# Patient Record
Sex: Female | Born: 1960
Health system: Southern US, Community
[De-identification: ages and names within clinical notes are randomized; demographics above are authoritative.]

## PROBLEM LIST (undated history)

## (undated) ENCOUNTER — Emergency Department (HOSPITAL_BASED_OUTPATIENT_CLINIC_OR_DEPARTMENT_OTHER): Admission: RE | Payer: BLUE CROSS/BLUE SHIELD | Source: Ambulatory Visit

## (undated) DIAGNOSIS — K5790 Diverticulosis of intestine, part unspecified, without perforation or abscess without bleeding: Secondary | ICD-10-CM

## (undated) DIAGNOSIS — N200 Calculus of kidney: Secondary | ICD-10-CM

## (undated) DIAGNOSIS — K219 Gastro-esophageal reflux disease without esophagitis: Secondary | ICD-10-CM

## (undated) DIAGNOSIS — K2210 Ulcer of esophagus without bleeding: Secondary | ICD-10-CM

## (undated) DIAGNOSIS — I10 Essential (primary) hypertension: Secondary | ICD-10-CM

## (undated) DIAGNOSIS — M722 Plantar fascial fibromatosis: Secondary | ICD-10-CM

## (undated) DIAGNOSIS — K449 Diaphragmatic hernia without obstruction or gangrene: Secondary | ICD-10-CM

## (undated) HISTORY — DX: Plantar fascial fibromatosis: M72.2

## (undated) HISTORY — DX: Diaphragmatic hernia without obstruction or gangrene: K44.9

## (undated) HISTORY — PX: HERNIA REPAIR: SHX51

## (undated) HISTORY — DX: Essential (primary) hypertension: I10

## (undated) HISTORY — DX: Gastro-esophageal reflux disease without esophagitis: K21.9

## (undated) HISTORY — PX: DILATION AND CURETTAGE OF UTERUS: SHX78

## (undated) HISTORY — DX: Ulcer of esophagus without bleeding: K22.10

## (undated) HISTORY — DX: Diverticulosis of intestine, part unspecified, without perforation or abscess without bleeding: K57.90

---

## 1998-02-21 ENCOUNTER — Inpatient Hospital Stay (HOSPITAL_COMMUNITY): Admission: AD | Admit: 1998-02-21 | Discharge: 1998-02-24 | Payer: Self-pay | Admitting: *Deleted

## 1998-04-05 ENCOUNTER — Ambulatory Visit (HOSPITAL_COMMUNITY): Admission: RE | Admit: 1998-04-05 | Discharge: 1998-04-05 | Payer: Self-pay | Admitting: Obstetrics and Gynecology

## 1999-04-09 ENCOUNTER — Other Ambulatory Visit: Admission: RE | Admit: 1999-04-09 | Discharge: 1999-04-09 | Payer: Self-pay | Admitting: Obstetrics and Gynecology

## 2000-03-11 ENCOUNTER — Ambulatory Visit (HOSPITAL_BASED_OUTPATIENT_CLINIC_OR_DEPARTMENT_OTHER): Admission: RE | Admit: 2000-03-11 | Discharge: 2000-03-11 | Payer: Self-pay | Admitting: *Deleted

## 2000-05-05 ENCOUNTER — Other Ambulatory Visit: Admission: RE | Admit: 2000-05-05 | Discharge: 2000-05-05 | Payer: Self-pay | Admitting: Obstetrics and Gynecology

## 2000-12-13 HISTORY — PX: INGUINAL HERNIA REPAIR: SHX194

## 2001-05-02 ENCOUNTER — Other Ambulatory Visit: Admission: RE | Admit: 2001-05-02 | Discharge: 2001-05-02 | Payer: Self-pay | Admitting: Obstetrics and Gynecology

## 2002-04-13 ENCOUNTER — Encounter: Admission: RE | Admit: 2002-04-13 | Discharge: 2002-04-13 | Payer: Self-pay | Admitting: Obstetrics and Gynecology

## 2002-04-13 ENCOUNTER — Encounter: Payer: Self-pay | Admitting: Obstetrics and Gynecology

## 2002-04-19 ENCOUNTER — Other Ambulatory Visit: Admission: RE | Admit: 2002-04-19 | Discharge: 2002-04-19 | Payer: Self-pay | Admitting: Obstetrics and Gynecology

## 2003-04-23 ENCOUNTER — Other Ambulatory Visit: Admission: RE | Admit: 2003-04-23 | Discharge: 2003-04-23 | Payer: Self-pay | Admitting: Obstetrics and Gynecology

## 2003-06-27 ENCOUNTER — Encounter: Payer: Self-pay | Admitting: Obstetrics and Gynecology

## 2003-06-27 ENCOUNTER — Encounter: Admission: RE | Admit: 2003-06-27 | Discharge: 2003-06-27 | Payer: Self-pay | Admitting: Obstetrics and Gynecology

## 2004-02-13 ENCOUNTER — Encounter: Admission: RE | Admit: 2004-02-13 | Discharge: 2004-02-13 | Payer: Self-pay | Admitting: Internal Medicine

## 2004-07-09 ENCOUNTER — Encounter: Admission: RE | Admit: 2004-07-09 | Discharge: 2004-07-09 | Payer: Self-pay | Admitting: Obstetrics and Gynecology

## 2005-08-05 ENCOUNTER — Encounter: Admission: RE | Admit: 2005-08-05 | Discharge: 2005-08-05 | Payer: Self-pay | Admitting: Obstetrics and Gynecology

## 2006-01-18 ENCOUNTER — Ambulatory Visit: Payer: Self-pay | Admitting: Internal Medicine

## 2006-01-21 ENCOUNTER — Ambulatory Visit: Payer: Self-pay | Admitting: Internal Medicine

## 2006-08-18 ENCOUNTER — Encounter: Admission: RE | Admit: 2006-08-18 | Discharge: 2006-08-18 | Payer: Self-pay | Admitting: Obstetrics and Gynecology

## 2007-09-11 ENCOUNTER — Encounter: Payer: Self-pay | Admitting: Internal Medicine

## 2007-09-11 DIAGNOSIS — G5600 Carpal tunnel syndrome, unspecified upper limb: Secondary | ICD-10-CM | POA: Insufficient documentation

## 2007-09-28 ENCOUNTER — Encounter: Admission: RE | Admit: 2007-09-28 | Discharge: 2007-09-28 | Payer: Self-pay | Admitting: Obstetrics and Gynecology

## 2007-10-02 ENCOUNTER — Encounter: Payer: Self-pay | Admitting: Internal Medicine

## 2008-10-25 ENCOUNTER — Encounter: Admission: RE | Admit: 2008-10-25 | Discharge: 2008-10-25 | Payer: Self-pay | Admitting: Obstetrics and Gynecology

## 2009-03-17 ENCOUNTER — Ambulatory Visit: Payer: Self-pay | Admitting: Internal Medicine

## 2009-03-17 LAB — CONVERTED CEMR LAB
AST: 22 units/L (ref 0–37)
Albumin: 3.6 g/dL (ref 3.5–5.2)
Alkaline Phosphatase: 59 units/L (ref 39–117)
BUN: 11 mg/dL (ref 6–23)
Bilirubin, Direct: 0.1 mg/dL (ref 0.0–0.3)
Calcium: 8.8 mg/dL (ref 8.4–10.5)
Creatinine, Ser: 0.8 mg/dL (ref 0.4–1.2)
Eosinophils Absolute: 0.3 10*3/uL (ref 0.0–0.7)
Leukocytes, UA: NEGATIVE
Lymphs Abs: 2.3 10*3/uL (ref 0.7–4.0)
Neutro Abs: 2.9 10*3/uL (ref 1.4–7.7)
Neutrophils Relative %: 49.7 % (ref 43.0–77.0)
Nitrite: NEGATIVE
Potassium: 4.2 meq/L (ref 3.5–5.1)
RBC: 4.5 M/uL (ref 3.87–5.11)
RDW: 12.2 % (ref 11.5–14.6)
Sodium: 144 meq/L (ref 135–145)
Total Protein: 6.2 g/dL (ref 6.0–8.3)
Urobilinogen, UA: 0.2 (ref 0.0–1.0)
WBC: 5.9 10*3/uL (ref 4.5–10.5)

## 2009-03-21 ENCOUNTER — Ambulatory Visit: Payer: Self-pay | Admitting: Internal Medicine

## 2009-03-21 DIAGNOSIS — M722 Plantar fascial fibromatosis: Secondary | ICD-10-CM

## 2009-03-24 LAB — CONVERTED CEMR LAB
Direct LDL: 182.6 mg/dL
Total CHOL/HDL Ratio: 5
Triglycerides: 158 mg/dL — ABNORMAL HIGH (ref 0.0–149.0)

## 2009-10-30 ENCOUNTER — Encounter: Admission: RE | Admit: 2009-10-30 | Discharge: 2009-10-30 | Payer: Self-pay | Admitting: Obstetrics and Gynecology

## 2010-11-03 ENCOUNTER — Encounter: Admission: RE | Admit: 2010-11-03 | Discharge: 2010-11-03 | Payer: Self-pay | Admitting: Obstetrics and Gynecology

## 2011-02-18 ENCOUNTER — Other Ambulatory Visit: Payer: Self-pay | Admitting: Internal Medicine

## 2011-02-18 ENCOUNTER — Other Ambulatory Visit: Payer: BC Managed Care – PPO

## 2011-02-18 ENCOUNTER — Encounter (INDEPENDENT_AMBULATORY_CARE_PROVIDER_SITE_OTHER): Payer: Self-pay | Admitting: *Deleted

## 2011-02-18 DIAGNOSIS — Z0000 Encounter for general adult medical examination without abnormal findings: Secondary | ICD-10-CM

## 2011-02-18 LAB — LIPID PANEL
Cholesterol: 194 mg/dL (ref 0–200)
Total CHOL/HDL Ratio: 4
Triglycerides: 49 mg/dL (ref 0.0–149.0)
VLDL: 9.8 mg/dL (ref 0.0–40.0)

## 2011-02-18 LAB — CBC WITH DIFFERENTIAL/PLATELET
Basophils Absolute: 0 10*3/uL (ref 0.0–0.1)
Basophils Relative: 0.8 % (ref 0.0–3.0)
Eosinophils Relative: 3.5 % (ref 0.0–5.0)
HCT: 40.5 % (ref 36.0–46.0)
Lymphocytes Relative: 44.4 % (ref 12.0–46.0)
Lymphs Abs: 2.3 10*3/uL (ref 0.7–4.0)
MCHC: 34.7 g/dL (ref 30.0–36.0)
MCV: 94.4 fl (ref 78.0–100.0)
Monocytes Relative: 4.5 % (ref 3.0–12.0)
Neutro Abs: 2.4 10*3/uL (ref 1.4–7.7)
RBC: 4.3 Mil/uL (ref 3.87–5.11)
RDW: 12.2 % (ref 11.5–14.6)

## 2011-02-18 LAB — HEPATIC FUNCTION PANEL
Albumin: 3.7 g/dL (ref 3.5–5.2)
Total Bilirubin: 0.6 mg/dL (ref 0.3–1.2)
Total Protein: 5.9 g/dL — ABNORMAL LOW (ref 6.0–8.3)

## 2011-02-18 LAB — BASIC METABOLIC PANEL
Calcium: 8.6 mg/dL (ref 8.4–10.5)
GFR: 101.17 mL/min (ref >60.00)
Sodium: 140 mEq/L (ref 135–145)

## 2011-02-18 LAB — URINALYSIS, ROUTINE W REFLEX MICROSCOPIC
Nitrite: NEGATIVE
pH: 6 (ref 5.0–8.0)

## 2011-02-23 ENCOUNTER — Encounter: Payer: Self-pay | Admitting: Internal Medicine

## 2011-02-23 ENCOUNTER — Encounter (INDEPENDENT_AMBULATORY_CARE_PROVIDER_SITE_OTHER): Payer: BC Managed Care – PPO | Admitting: Internal Medicine

## 2011-02-23 DIAGNOSIS — M2550 Pain in unspecified joint: Secondary | ICD-10-CM | POA: Insufficient documentation

## 2011-02-23 DIAGNOSIS — Z0000 Encounter for general adult medical examination without abnormal findings: Secondary | ICD-10-CM

## 2011-02-23 DIAGNOSIS — R030 Elevated blood-pressure reading, without diagnosis of hypertension: Secondary | ICD-10-CM | POA: Insufficient documentation

## 2011-02-23 DIAGNOSIS — I10 Essential (primary) hypertension: Secondary | ICD-10-CM | POA: Insufficient documentation

## 2011-03-02 NOTE — Assessment & Plan Note (Signed)
Summary: PHYSICAL / LOV 2010 /NWS  #   Vital Signs:  Patient profile:   50 year old female Height:      61 inches Weight:      140 pounds BMI:     26.55 Temp:     98.6 degrees F oral Pulse rate:   88 / minute Pulse rhythm:   regular Resp:     16 per minute BP sitting:   150 / 100  (right arm) Cuff size:   regular  Vitals Entered By: Lanier Prude, Beverly Gust) (February 23, 2011 2:29 PM) CC: CPX Is Patient Diabetic? No   CC:  CPX.  History of Present Illness: The patient presents for a preventive health examination C/o B hips hurt sometimes  Current Medications (verified): 1)  Vitamin D3 1000 Unit  Tabs (Cholecalciferol) .Marland Kitchen.. 1 By Mouth Daily 2)  Loratadine 10 Mg Tabs (Loratadine) .Marland Kitchen.. 1 By Mouth Once Daily  Allergies (verified): 1)  ! Lidocaine in D5w (Lidocaine in D5w)  Past History:  Past Medical History: Last updated: 03/21/2009 Gestational DM Plantar fasciitis GYN Dr Billy Coast  Past Surgical History: Last updated: 09/11/2007 Inguinal herniorrhaphy  Family History: Last updated: 02/23/2011 No CAD S HTN  Social History: Last updated: 02/23/2011 Occupation:housewife Married, 2 sons Never Smoked Regular exercise-yoga  Family History: No CAD S HTN  Social History: Occupation:housewife Married, 2 sons Never Smoked Regular exercise-yoga  Review of Systems  The patient denies anorexia, fever, weight loss, weight gain, vision loss, decreased hearing, hoarseness, chest pain, syncope, dyspnea on exertion, peripheral edema, prolonged cough, headaches, hemoptysis, abdominal pain, melena, hematochezia, severe indigestion/heartburn, hematuria, incontinence, genital sores, muscle weakness, suspicious skin lesions, transient blindness, difficulty walking, depression, unusual weight change, abnormal bleeding, enlarged lymph nodes, angioedema, and breast masses.    Physical Exam  General:  Well-developed,well-nourished,in no acute distress; alert,appropriate and  cooperative throughout examination Head:  Normocephalic and atraumatic without obvious abnormalities. No apparent alopecia or balding. Eyes:  No corneal or conjunctival inflammation noted. EOMI. Perrla. Ears:  External ear exam shows no significant lesions or deformities.  Otoscopic examination reveals clear canals, tympanic membranes are intact bilaterally without bulging, retraction, inflammation or discharge. Hearing is grossly normal bilaterally. Nose:  External nasal examination shows no deformity or inflammation. Nasal mucosa are pink and moist without lesions or exudates. Mouth:  Oral mucosa and oropharynx without lesions or exudates.  Teeth in good repair. Neck:  No deformities, masses, or tenderness noted. Lungs:  Normal respiratory effort, chest expands symmetrically. Lungs are clear to auscultation, no crackles or wheezes. Heart:  Normal rate and regular rhythm. S1 and S2 normal without gallop, murmur, click, rub or other extra sounds. Abdomen:  Bowel sounds positive,abdomen soft and non-tender without masses, organomegaly or hernias noted. Msk:  No deformity or scoliosis noted of thoracic or lumbar spine.  R heel is not  tender. B hips tender Pulses:  R and L carotid,radial,femoral,dorsalis pedis and posterior tibial pulses are full and equal bilaterally Extremities:  No clubbing, cyanosis, edema, or deformity noted with normal full range of motion of all joints.   Neurologic:  No cranial nerve deficits noted. Station and gait are normal. Plantar reflexes are down-going bilaterally. DTRs are symmetrical throughout. Sensory, motor and coordinative functions appear intact. Skin:  Intact without suspicious lesions or rashes Moles on face Cervical Nodes:  No lymphadenopathy noted Inguinal Nodes:  No significant adenopathy Psych:  Cognition and judgment appear intact. Alert and cooperative with normal attention span and concentration. No apparent delusions, illusions,  hallucinations   Impression & Recommendations:  Problem # 1:  WELL ADULT EXAM (ICD-V70.0) Assessment New Health and age related issues were discussed. Available screening tests and vaccinations were discussed as well. Healthy life style including good diet and exercise was discussed.  The labs were reviewed with the patient.  Orders: EKG w/ Interpretation (93000)nl  Problem # 2:  ELEVATED BLOOD PRESSURE (ICD-796.2) Assessment: New  Her updated medication list for this problem includes:    Losartan Potassium 100 Mg Tabs (Losartan potassium) .Marland Kitchen... 1 by mouth once daily for blood pressure  Problem # 3:  HIP PAIN (ICD-719.45) B troch bursitis Assessment: New Motrin 2 two times a day We can inject if needed  Complete Medication List: 1)  Vitamin D3 1000 Unit Tabs (Cholecalciferol) .Marland Kitchen.. 1 by mouth daily 2)  Loratadine 10 Mg Tabs (Loratadine) .Marland Kitchen.. 1 by mouth once daily 3)  Losartan Potassium 100 Mg Tabs (Losartan potassium) .Marland Kitchen.. 1 by mouth once daily for blood pressure 4)  Blood Pressure Monitor  .... Use once daily  Patient Instructions: 1)  Please schedule a follow-up appointment in 3 months. 2)  Normal BP <130/85 3)  Go on Youtube ([email or web address].com) and look up "piriformis stretch",  "IT band stretch" and "gluteus stretch". Learn the anatomy and learn the symptoms. Do the stretches - it may help!  Prescriptions: BLOOD PRESSURE MONITOR use once daily  #1 x 0   Entered and Authorized by:   Tresa Garter MD   Signed by:   Tresa Garter MD on 02/23/2011   Method used:   Print then Give to Patient   RxID:   [id number] ZDGLOVFI POTASSIUM 100 MG TABS (LOSARTAN POTASSIUM) 1 by mouth once daily for blood pressure  #30 x 12   Entered and Authorized by:   Tresa Garter MD   Signed by:   Tresa Garter MD on 02/23/2011   Method used:   Print then Give to Patient   RxID:   [id number]    Orders Added: 1)  EKG w/ Interpretation [93000] 2)  Est. Patient  40-64 years [01601]

## 2011-04-30 NOTE — Op Note (Signed)
[place]  Patient:    Cheryl Jones, Cheryl Jones                        MRN: [id number] Proc. Date: 03/11/00 Adm. Date:  [id number] Attending:  Stephenie Acres                           Operative Report  PREOPERATIVE DIAGNOSIS:  Left inguinal hernia.  POSTOPERATIVE DIAGNOSIS:  Left inguinal hernia.  OPERATION:  Laparoscopic left inguinal hernia repair.  SURGEON:  Catalina Lunger, M.D.  ANESTHESIA:  General  DESCRIPTION OF PROCEDURE:  The patient was taken to the operating room and placed in the supine position.  After adequate anesthesia was induced using endotracheal tube, the abdomen was prepped and draped in a normal sterile fashion. Foley catheter was placed.  A transverse infraumbilical incision was made, dissected own to the anterior left rectus sheath.  Small transverse incision was made in this  left and the left rectus muscle fibers were retracted laterally.  Blunt dissection was carried out in this preperitoneal space down to the pubic tubercle. Dissecting balloon was then placed into the identical space and the balloon manually insufflated under camera direction. Good dissection was obtained.  Operating trocar was then inserted and the operating balloon was insufflated.  Pneumoperitoneum as obtained with carbon dioxide.  Under direct visualization two 5 mm ports were placed just right of the midline in the lower abdomen.  Dissection began down at the pubic tubercle which was completely cleaned off as was the Coopers ligament. Hypogastric vessels which appeared to have partially fallen were clipped and divided as they lay right near the hernia defect.  A fairly large indirect hernia sac was then reduced and dissected free of surrounding tissues.  Dissection continued removing all areolar tissue out to the anterior superior iliac spine. A piece of 4 x 6 onlay atria mesh was then placed into the preperitoneal  space, was tacked medially just to the right of the midline to the pubic tubercle along the left Coopers ligament along the anterior abdominal wall completely covering the  direct hernia defect space, out laterally to the anterior superior iliac spine.  The indirect hernia sac was then tacked out laterally to the mesh as well. Adequate hemostasis was ensured.  The pneumoperitoneum was released.  All trocars were removed.  The anterior sheath defect was closed with a 0 Vicryl figure-of-eight suture. Skin incisions were closed with subcuticular 4-0 Monocryl. Steri-Strips and sterile dressings were applied.  All incisions were injected using Marcaine.  The patient tolerated the procedure well and went to the PACU in good condition. DD:  03/11/00 TD:  03/12/00 Job: [id number]

## 2011-06-04 ENCOUNTER — Encounter: Payer: Self-pay | Admitting: Internal Medicine

## 2011-06-07 ENCOUNTER — Encounter: Payer: Self-pay | Admitting: Internal Medicine

## 2011-06-07 ENCOUNTER — Ambulatory Visit (INDEPENDENT_AMBULATORY_CARE_PROVIDER_SITE_OTHER): Payer: BC Managed Care – PPO | Admitting: Internal Medicine

## 2011-06-07 DIAGNOSIS — I10 Essential (primary) hypertension: Secondary | ICD-10-CM

## 2011-06-07 DIAGNOSIS — M25559 Pain in unspecified hip: Secondary | ICD-10-CM

## 2011-06-07 DIAGNOSIS — R030 Elevated blood-pressure reading, without diagnosis of hypertension: Secondary | ICD-10-CM

## 2011-06-07 DIAGNOSIS — H6590 Unspecified nonsuppurative otitis media, unspecified ear: Secondary | ICD-10-CM

## 2011-06-07 MED ORDER — AMOXICILLIN 500 MG PO CAPS: 1000.0000 mg | ORAL_CAPSULE | Freq: Two times a day (BID) | ORAL | Status: AC

## 2011-06-07 MED ORDER — MELOXICAM 15 MG PO TABS: 15.0000 mg | ORAL_TABLET | Freq: Every day | ORAL | Status: DC | PRN

## 2011-06-07 MED ORDER — FLUTICASONE PROPIONATE 50 MCG/ACT NA SUSP: 1.0000 | Freq: Every day | NASAL | Status: DC

## 2011-06-07 NOTE — Assessment & Plan Note (Signed)
Cont ARB

## 2011-06-07 NOTE — Progress Notes (Signed)
  Subjective:    Patient ID: Cheryl Jones, female    DOB: 1961/09/15, 50 y.o.   MRN: [id number]  HPI F/u HTN C/o URI sx' s C/o R hip pain Scuba dove  In the keys - ears not feeling right since memorial wknd  Review of Systems  Constitutional: Negative for chills, activity change, appetite change, fatigue and unexpected weight change.  HENT: Positive for congestion. Negative for mouth sores and sinus pressure.   Eyes: Negative for visual disturbance.  Respiratory: Negative for cough and chest tightness.   Gastrointestinal: Negative for nausea and abdominal pain.  Genitourinary: Negative for frequency, difficulty urinating and vaginal pain.  Musculoskeletal: Positive for arthralgias (B hip pain). Negative for back pain and gait problem.  Skin: Negative for pallor and rash.  Neurological: Negative for dizziness, tremors, weakness, numbness and headaches.  Psychiatric/Behavioral: Negative for confusion and sleep disturbance.       Objective:   Physical Exam  Constitutional: She appears well-developed and well-nourished. No distress.  HENT:  Head: Normocephalic.  Right Ear: External ear normal.  Left Ear: External ear normal.  Nose: Nose normal.  Mouth/Throat: Oropharynx is clear and moist.       Fluid behind B TMs, no redness  Eyes: Conjunctivae are normal. Pupils are equal, round, and reactive to light. Right eye exhibits no discharge. Left eye exhibits no discharge.  Neck: Normal range of motion. Neck supple. No JVD present. No tracheal deviation present. No thyromegaly present.  Cardiovascular: Normal rate, regular rhythm and normal heart sounds.   Pulmonary/Chest: No stridor. No respiratory distress. She has no wheezes.  Abdominal: Soft. Bowel sounds are normal. She exhibits no distension and no mass. There is no tenderness. There is no rebound and no guarding.  Musculoskeletal: She exhibits tenderness (B troch maj tender). She exhibits no edema.  Lymphadenopathy:    She has  no cervical adenopathy.  Neurological: She displays normal reflexes. No cranial nerve deficit. She exhibits normal muscle tone. Coordination normal.  Skin: No rash noted. No erythema.  Psychiatric: She has a normal mood and affect. Her behavior is normal. Judgment and thought content normal.          Assessment & Plan:

## 2011-06-07 NOTE — Assessment & Plan Note (Signed)
Better at home

## 2011-06-07 NOTE — Assessment & Plan Note (Signed)
Meloxicam; stretcing Will inject if not better

## 2011-06-07 NOTE — Assessment & Plan Note (Signed)
Amox Flonase

## 2011-06-14 ENCOUNTER — Other Ambulatory Visit: Payer: Self-pay | Admitting: *Deleted

## 2011-06-14 MED ORDER — LOSARTAN POTASSIUM 100 MG PO TABS: 100.0000 mg | ORAL_TABLET | Freq: Every day | ORAL | Status: DC

## 2011-06-24 ENCOUNTER — Telehealth: Payer: Self-pay

## 2011-06-24 DIAGNOSIS — H9209 Otalgia, unspecified ear: Secondary | ICD-10-CM

## 2011-06-24 NOTE — Telephone Encounter (Signed)
Patient called lm on triage stating that "her ears are still stopped up after taking antibiotics". She would like to know if she needs to see an ENT Md. Please advise Thanks

## 2011-06-25 NOTE — Telephone Encounter (Signed)
Yes. ENT cons (done) Thx

## 2011-06-28 NOTE — Telephone Encounter (Signed)
Patient notified per MD.

## 2011-07-08 ENCOUNTER — Telehealth: Payer: Self-pay | Admitting: *Deleted

## 2011-07-08 NOTE — Telephone Encounter (Signed)
Spoke w/pt - she is worried about shingles. She was around family member recently w/shingles. She did not have direct exposure to pt's blisters. She has small sore on back of her calf and is worried. Advised her to call office tomorrow for OV for eval with symptoms change, increase etc.

## 2011-07-08 NOTE — Telephone Encounter (Signed)
rec rf req for Valtrex. Please advise

## 2011-07-09 MED ORDER — VALACYCLOVIR HCL 500 MG PO TABS: 500.0000 mg | ORAL_TABLET | Freq: Two times a day (BID) | ORAL | Status: AC

## 2011-07-09 NOTE — Telephone Encounter (Signed)
Ok done. OV if problems Thx

## 2011-07-09 NOTE — Telephone Encounter (Signed)
Pt previously informed OV is symptoms worsen or persist.

## 2011-09-09 ENCOUNTER — Ambulatory Visit: Payer: BC Managed Care – PPO | Admitting: Internal Medicine

## 2011-09-20 ENCOUNTER — Encounter: Payer: Self-pay | Admitting: Internal Medicine

## 2011-09-20 ENCOUNTER — Ambulatory Visit (INDEPENDENT_AMBULATORY_CARE_PROVIDER_SITE_OTHER): Payer: BC Managed Care – PPO | Admitting: Internal Medicine

## 2011-09-20 ENCOUNTER — Ambulatory Visit (INDEPENDENT_AMBULATORY_CARE_PROVIDER_SITE_OTHER)
Admission: RE | Admit: 2011-09-20 | Discharge: 2011-09-20 | Disposition: A | Discharge status: Home / Self Care | Payer: BC Managed Care – PPO | Source: Ambulatory Visit | Attending: Internal Medicine | Admitting: Internal Medicine

## 2011-09-20 ENCOUNTER — Other Ambulatory Visit: Payer: Self-pay

## 2011-09-20 VITALS — BP 150/108 | HR 80 | Temp 98.4°F | Resp 16 | Wt 148.0 lb

## 2011-09-20 DIAGNOSIS — M25559 Pain in unspecified hip: Secondary | ICD-10-CM

## 2011-09-20 DIAGNOSIS — M722 Plantar fascial fibromatosis: Secondary | ICD-10-CM

## 2011-09-20 DIAGNOSIS — Z23 Encounter for immunization: Secondary | ICD-10-CM

## 2011-09-20 DIAGNOSIS — I10 Essential (primary) hypertension: Secondary | ICD-10-CM

## 2011-09-20 MED ORDER — TRAMADOL HCL 50 MG PO TABS: 50.0000 mg | ORAL_TABLET | Freq: Two times a day (BID) | ORAL | Status: AC | PRN

## 2011-09-20 MED ORDER — NABUMETONE 500 MG PO TABS: 500.0000 mg | ORAL_TABLET | Freq: Two times a day (BID) | ORAL | Status: DC

## 2011-09-20 MED ORDER — VALSARTAN-HYDROCHLOROTHIAZIDE 320-25 MG PO TABS: 1.0000 | ORAL_TABLET | Freq: Every day | ORAL | Status: DC

## 2011-09-20 NOTE — Progress Notes (Signed)
  Subjective:    Patient ID: Cheryl Jones, female    DOB: May 05, 1961, 50 y.o.   MRN: [id number]  HPI  C/o elev BP and L>R foot swelling x 1 wk   Review of Systems  Constitutional: Negative for chills, activity change, appetite change, fatigue and unexpected weight change.  HENT: Negative for congestion, mouth sores and sinus pressure.   Eyes: Negative for visual disturbance.  Respiratory: Negative for cough and chest tightness.   Gastrointestinal: Negative for nausea and abdominal pain.  Genitourinary: Negative for frequency, difficulty urinating and vaginal pain.  Musculoskeletal: Negative for back pain and gait problem.       B hips are tender in troch area  Skin: Negative for pallor and rash.  Neurological: Negative for dizziness, tremors, weakness, numbness and headaches.  Psychiatric/Behavioral: Negative for confusion and sleep disturbance.   Wt Readings from Last 3 Encounters:  09/20/11 148 lb (67.132 kg)  06/07/11 142 lb (64.411 kg)  02/23/11 140 lb (63.504 kg)   BP Readings from Last 3 Encounters:  09/20/11 150/108  06/07/11 140/98  02/23/11 150/100        Objective:   Physical Exam  Constitutional: She appears well-developed and well-nourished. No distress.  HENT:  Head: Normocephalic.  Right Ear: External ear normal.  Left Ear: External ear normal.  Nose: Nose normal.  Mouth/Throat: Oropharynx is clear and moist.  Eyes: Conjunctivae are normal. Pupils are equal, round, and reactive to light. Right eye exhibits no discharge. Left eye exhibits no discharge.  Neck: Normal range of motion. Neck supple. No JVD present. No tracheal deviation present. No thyromegaly present.  Cardiovascular: Normal rate, regular rhythm and normal heart sounds.   Pulmonary/Chest: No stridor. No respiratory distress. She has no wheezes.  Abdominal: Soft. Bowel sounds are normal. She exhibits no distension and no mass. There is no tenderness. There is no rebound and no guarding.    Musculoskeletal: She exhibits tenderness (B troch major areas are tender). She exhibits no edema.  Lymphadenopathy:    She has no cervical adenopathy.  Neurological: She displays normal reflexes. No cranial nerve deficit. She exhibits normal muscle tone. Coordination normal.  Skin: No rash noted. No erythema.  Psychiatric: She has a normal mood and affect. Her behavior is normal. Judgment and thought content normal.          Assessment & Plan:

## 2011-09-20 NOTE — Assessment & Plan Note (Addendum)
X ray Ortho f/u consult See Meds Stretch

## 2011-09-21 ENCOUNTER — Telehealth: Payer: Self-pay | Admitting: *Deleted

## 2011-09-21 DIAGNOSIS — M25559 Pain in unspecified hip: Secondary | ICD-10-CM

## 2011-09-21 NOTE — Telephone Encounter (Signed)
Patient requesting results of Xray.

## 2011-09-21 NOTE — Telephone Encounter (Signed)
Left detailed VM for patient.

## 2011-09-21 NOTE — Telephone Encounter (Signed)
There is bursitis of the hip present with calcium deposits due to inflamation. Steroid injection should help. I will ref to Dr Lequita Halt Cont with current meds Thx

## 2011-09-30 NOTE — Assessment & Plan Note (Signed)
Resolved

## 2011-09-30 NOTE — Assessment & Plan Note (Signed)
See Meds 

## 2011-11-16 ENCOUNTER — Ambulatory Visit: Payer: BC Managed Care – PPO | Admitting: Internal Medicine

## 2011-12-17 ENCOUNTER — Other Ambulatory Visit: Payer: Self-pay | Admitting: Obstetrics and Gynecology

## 2011-12-17 DIAGNOSIS — Z1231 Encounter for screening mammogram for malignant neoplasm of breast: Secondary | ICD-10-CM

## 2011-12-30 ENCOUNTER — Other Ambulatory Visit: Payer: Self-pay | Admitting: *Deleted

## 2011-12-30 MED ORDER — VALSARTAN-HYDROCHLOROTHIAZIDE 320-25 MG PO TABS: 1.0000 | ORAL_TABLET | Freq: Every day | ORAL | Status: DC

## 2012-01-07 ENCOUNTER — Ambulatory Visit: Payer: BC Managed Care – PPO

## 2012-01-11 ENCOUNTER — Ambulatory Visit (INDEPENDENT_AMBULATORY_CARE_PROVIDER_SITE_OTHER): Payer: BC Managed Care – PPO | Admitting: Internal Medicine

## 2012-01-11 ENCOUNTER — Encounter: Payer: Self-pay | Admitting: Internal Medicine

## 2012-01-11 VITALS — BP 120/80 | HR 84 | Temp 97.5°F | Resp 16 | Wt 152.0 lb

## 2012-01-11 DIAGNOSIS — M25559 Pain in unspecified hip: Secondary | ICD-10-CM

## 2012-01-11 DIAGNOSIS — R7309 Other abnormal glucose: Secondary | ICD-10-CM

## 2012-01-11 DIAGNOSIS — R42 Dizziness and giddiness: Secondary | ICD-10-CM

## 2012-01-11 DIAGNOSIS — I10 Essential (primary) hypertension: Secondary | ICD-10-CM

## 2012-01-11 DIAGNOSIS — R739 Hyperglycemia, unspecified: Secondary | ICD-10-CM | POA: Insufficient documentation

## 2012-01-11 MED ORDER — NABUMETONE 500 MG PO TABS: 500.0000 mg | ORAL_TABLET | Freq: Two times a day (BID) | ORAL | Status: DC

## 2012-01-11 NOTE — Assessment & Plan Note (Signed)
Continue with current prescription therapy as reflected on the Med list.  

## 2012-01-11 NOTE — Assessment & Plan Note (Signed)
Not sure of the cause. Possible atypical migraine w/o HA Call me if sx's not resolved by tomorrow

## 2012-01-11 NOTE — Progress Notes (Signed)
  Subjective:    Patient ID: Cheryl Jones, female    DOB: 10-29-1961, 51 y.o.   MRN: [id number]  HPI  F/u R hip pain - better F/u HTN  C/o dizziness since this am  Review of Systems  Constitutional: Negative.  Negative for fever, chills, diaphoresis, activity change, appetite change, fatigue and unexpected weight change.  HENT: Negative for hearing loss, ear pain, nosebleeds, congestion, sore throat, facial swelling, rhinorrhea, sneezing, mouth sores, trouble swallowing, neck pain, neck stiffness, postnasal drip, sinus pressure and tinnitus.   Eyes: Negative for pain, discharge, redness, itching and visual disturbance.  Respiratory: Negative for cough, chest tightness, shortness of breath, wheezing and stridor.   Cardiovascular: Negative for chest pain, palpitations and leg swelling.  Gastrointestinal: Negative for nausea, diarrhea, constipation, blood in stool, abdominal distention, anal bleeding and rectal pain.  Genitourinary: Negative for dysuria, urgency, frequency, hematuria, flank pain, vaginal bleeding, vaginal discharge, difficulty urinating, genital sores and pelvic pain.  Musculoskeletal: Negative for back pain, joint swelling, arthralgias and gait problem.  Skin: Negative.  Negative for rash.  Neurological: Positive for light-headedness (since this am). Negative for dizziness, tremors, seizures, syncope, speech difficulty, weakness, numbness and headaches.  Hematological: Negative for adenopathy. Does not bruise/bleed easily.  Psychiatric/Behavioral: Negative for suicidal ideas, behavioral problems, sleep disturbance, dysphoric mood and decreased concentration. The patient is not nervous/anxious.        Objective:   Physical Exam  Constitutional: She appears well-developed. No distress.  HENT:  Head: Normocephalic.  Right Ear: External ear normal.  Left Ear: External ear normal.  Nose: Nose normal.  Mouth/Throat: Oropharynx is clear and moist.  Eyes: Conjunctivae are  normal. Pupils are equal, round, and reactive to light. Right eye exhibits no discharge. Left eye exhibits no discharge.  Neck: Normal range of motion. Neck supple. No JVD present. No tracheal deviation present. No thyromegaly present.  Cardiovascular: Normal rate, regular rhythm and normal heart sounds.   Pulmonary/Chest: No stridor. No respiratory distress. She has no wheezes.  Abdominal: Soft. Bowel sounds are normal. She exhibits no distension and no mass. There is no tenderness. There is no rebound and no guarding.  Musculoskeletal: She exhibits tenderness (B hips are tender R>>L). She exhibits no edema.  Lymphadenopathy:    She has no cervical adenopathy.  Neurological: She displays normal reflexes. No cranial nerve deficit. She exhibits normal muscle tone. Coordination normal.  Skin: No rash noted. No erythema.  Psychiatric: She has a normal mood and affect. Her behavior is normal. Judgment and thought content normal.          Assessment & Plan:

## 2012-01-11 NOTE — Assessment & Plan Note (Signed)
S/p Ortho eval and 1 inj MRI is pending She has to use Relafen daily.Marland KitchenMarland Kitchen

## 2012-01-11 NOTE — Assessment & Plan Note (Signed)
1/13 with a h/o gestational DM Glucometer provided - call me w/several measurements

## 2012-01-21 ENCOUNTER — Ambulatory Visit
Admission: RE | Admit: 2012-01-21 | Discharge: 2012-01-21 | Disposition: A | Discharge status: Home / Self Care | Payer: BC Managed Care – PPO | Source: Ambulatory Visit | Attending: Obstetrics and Gynecology | Admitting: Obstetrics and Gynecology

## 2012-01-21 DIAGNOSIS — Z1231 Encounter for screening mammogram for malignant neoplasm of breast: Secondary | ICD-10-CM

## 2012-01-26 ENCOUNTER — Other Ambulatory Visit: Payer: Self-pay | Admitting: Obstetrics and Gynecology

## 2012-01-26 DIAGNOSIS — R928 Other abnormal and inconclusive findings on diagnostic imaging of breast: Secondary | ICD-10-CM

## 2012-02-04 ENCOUNTER — Other Ambulatory Visit: Payer: BC Managed Care – PPO

## 2012-02-14 ENCOUNTER — Ambulatory Visit
Admission: RE | Admit: 2012-02-14 | Discharge: 2012-02-14 | Disposition: A | Discharge status: Home / Self Care | Payer: BC Managed Care – PPO | Source: Ambulatory Visit | Attending: Obstetrics and Gynecology | Admitting: Obstetrics and Gynecology

## 2012-02-14 DIAGNOSIS — R928 Other abnormal and inconclusive findings on diagnostic imaging of breast: Secondary | ICD-10-CM

## 2012-02-17 ENCOUNTER — Telehealth: Payer: Self-pay | Admitting: *Deleted

## 2012-02-17 NOTE — Telephone Encounter (Signed)
Left mess for patient to call back.  

## 2012-02-17 NOTE — Telephone Encounter (Signed)
Pt calling stating she has rash/itching X 6 days. She thinks it could be from Relafen. She states she has stopped all meds except her Diovan HCT. She already takes a loratadine everyday for allergies. What else can she do/take? Could this be from Relafen? Please advise.

## 2012-02-17 NOTE — Telephone Encounter (Signed)
Pt informed

## 2012-02-17 NOTE — Telephone Encounter (Signed)
Could be from relafen. For itch - increase claritin to 10 mg bid, add zantac 150 bid.               Tepid to cool bath with baking soda or Aveeno

## 2012-11-05 ENCOUNTER — Other Ambulatory Visit: Payer: Self-pay | Admitting: Internal Medicine

## 2012-12-26 ENCOUNTER — Encounter: Payer: Self-pay | Admitting: Gastroenterology

## 2013-02-06 ENCOUNTER — Encounter: Payer: BC Managed Care – PPO | Admitting: Gastroenterology

## 2013-02-15 ENCOUNTER — Encounter: Payer: Self-pay | Admitting: Gastroenterology

## 2013-03-07 ENCOUNTER — Other Ambulatory Visit: Payer: Self-pay | Admitting: Internal Medicine

## 2013-03-08 ENCOUNTER — Encounter: Payer: BC Managed Care – PPO | Admitting: Gastroenterology

## 2013-03-23 ENCOUNTER — Encounter: Payer: Self-pay | Admitting: Gastroenterology

## 2013-03-31 ENCOUNTER — Other Ambulatory Visit: Payer: Self-pay | Admitting: Internal Medicine

## 2013-04-02 ENCOUNTER — Encounter: Payer: Self-pay | Admitting: *Deleted

## 2013-04-05 ENCOUNTER — Encounter: Payer: BC Managed Care – PPO | Admitting: Gastroenterology

## 2013-04-09 ENCOUNTER — Telehealth: Payer: Self-pay | Admitting: Internal Medicine

## 2013-04-09 NOTE — Telephone Encounter (Signed)
Called pt to schedule ov with Dr. Macario Golds. Left VM multiple time to call back and schedule the appt. Waittng for pt to call back

## 2013-04-10 ENCOUNTER — Encounter: Payer: BC Managed Care – PPO | Admitting: Gastroenterology

## 2013-04-20 ENCOUNTER — Encounter: Payer: Self-pay | Admitting: Internal Medicine

## 2013-04-20 ENCOUNTER — Other Ambulatory Visit (INDEPENDENT_AMBULATORY_CARE_PROVIDER_SITE_OTHER): Payer: BC Managed Care – PPO

## 2013-04-20 ENCOUNTER — Telehealth: Payer: Self-pay | Admitting: *Deleted

## 2013-04-20 ENCOUNTER — Ambulatory Visit (INDEPENDENT_AMBULATORY_CARE_PROVIDER_SITE_OTHER): Payer: BC Managed Care – PPO | Admitting: Internal Medicine

## 2013-04-20 VITALS — BP 160/90 | HR 76 | Temp 98.6°F | Resp 16 | Ht 61.0 in | Wt 152.0 lb

## 2013-04-20 DIAGNOSIS — I10 Essential (primary) hypertension: Secondary | ICD-10-CM

## 2013-04-20 DIAGNOSIS — M25559 Pain in unspecified hip: Secondary | ICD-10-CM

## 2013-04-20 DIAGNOSIS — Z0000 Encounter for general adult medical examination without abnormal findings: Secondary | ICD-10-CM

## 2013-04-20 DIAGNOSIS — R7309 Other abnormal glucose: Secondary | ICD-10-CM

## 2013-04-20 DIAGNOSIS — R739 Hyperglycemia, unspecified: Secondary | ICD-10-CM

## 2013-04-20 LAB — CBC WITH DIFFERENTIAL/PLATELET
Basophils Relative: 0.6 % (ref 0.0–3.0)
Eosinophils Absolute: 0.2 10*3/uL (ref 0.0–0.7)
Eosinophils Relative: 2.8 % (ref 0.0–5.0)
HCT: 40.8 % (ref 36.0–46.0)
Lymphs Abs: 2.6 10*3/uL (ref 0.7–4.0)
MCHC: 35.1 g/dL (ref 30.0–36.0)
MCV: 92.1 fl (ref 78.0–100.0)
Monocytes Absolute: 0.4 10*3/uL (ref 0.1–1.0)
RBC: 4.43 Mil/uL (ref 3.87–5.11)
WBC: 6.9 10*3/uL (ref 4.5–10.5)

## 2013-04-20 LAB — LIPID PANEL
Cholesterol: 222 mg/dL — ABNORMAL HIGH (ref 0–200)
Total CHOL/HDL Ratio: 4

## 2013-04-20 LAB — BASIC METABOLIC PANEL
CO2: 25 mEq/L (ref 19–32)
Chloride: 104 mEq/L (ref 96–112)
Creatinine, Ser: 0.8 mg/dL (ref 0.4–1.2)
Glucose, Bld: 82 mg/dL (ref 70–99)
Sodium: 136 mEq/L (ref 135–145)

## 2013-04-20 LAB — URINALYSIS
Bilirubin Urine: NEGATIVE
Hgb urine dipstick: NEGATIVE
Ketones, ur: NEGATIVE
Total Protein, Urine: NEGATIVE
Urine Glucose: NEGATIVE

## 2013-04-20 LAB — HEPATIC FUNCTION PANEL
ALT: 13 U/L (ref 0–35)
AST: 18 U/L (ref 0–37)
Albumin: 3.7 g/dL (ref 3.5–5.2)
Total Protein: 6.3 g/dL (ref 6.0–8.3)

## 2013-04-20 MED ORDER — MELOXICAM 15 MG PO TABS: 15.0000 mg | ORAL_TABLET | Freq: Every day | ORAL | Status: DC | PRN

## 2013-04-20 NOTE — Telephone Encounter (Signed)
CPX labs entered into Epic for 04/2014 CPX appointment.

## 2013-04-20 NOTE — Telephone Encounter (Signed)
Message copied by Carin Primrose on Fri Apr 20, 2013  9:17 AM ------      Message from: Etheleen Sia      Created: Fri Apr 20, 2013  9:00 AM      Regarding: LAB       PHYSICAL LABS IN MAY 2015 ------

## 2013-04-20 NOTE — Assessment & Plan Note (Signed)
We discussed age appropriate health related issues, including available/recomended screening tests and vaccinations. We discussed a need for adhering to healthy diet and exercise. Labs/EKG were reviewed/ordered. All questions were answered.  Ophth exam q 1-2 years Colonoscopy is scheduled Labs

## 2013-04-20 NOTE — Patient Instructions (Addendum)
Normal BP is <130/85 

## 2013-04-20 NOTE — Assessment & Plan Note (Signed)
5/14 elev BP [facility] BP at home - email me the numbers

## 2013-04-20 NOTE — Assessment & Plan Note (Signed)
BMET 

## 2013-04-20 NOTE — Assessment & Plan Note (Signed)
B trochanteric bursitis 2012 Now it is R>>L Dr Lequita Halt Try Meloxicam prn

## 2013-04-20 NOTE — Progress Notes (Signed)
Subjective:    Patient ID: Cheryl Jones, female    DOB: September 27, 1961, 52 y.o.   MRN: [id number]  HPI  The patient is here for a wellness exam. The patient has been doing well overall without major physical or psychological issues going on lately, except for her R>L hip pain is still bothering.  Review of Systems  Constitutional: Negative.  Negative for fever, chills, diaphoresis, activity change, appetite change, fatigue and unexpected weight change.  HENT: Negative for hearing loss, ear pain, nosebleeds, congestion, sore throat, facial swelling, rhinorrhea, sneezing, mouth sores, trouble swallowing, neck pain, neck stiffness, postnasal drip, sinus pressure and tinnitus.   Eyes: Negative for pain, discharge, redness, itching and visual disturbance.  Respiratory: Negative for cough, chest tightness, shortness of breath, wheezing and stridor.   Cardiovascular: Negative for chest pain, palpitations and leg swelling.  Gastrointestinal: Negative for nausea, diarrhea, constipation, blood in stool, abdominal distention, anal bleeding and rectal pain.  Genitourinary: Negative for dysuria, urgency, frequency, hematuria, flank pain, vaginal bleeding, vaginal discharge, difficulty urinating, genital sores and pelvic pain.  Musculoskeletal: Positive for arthralgias (R hip). Negative for back pain, joint swelling and gait problem.  Skin: Negative.  Negative for rash.  Neurological: Negative for dizziness, tremors, seizures, syncope, speech difficulty, weakness, numbness and headaches.  Hematological: Negative for adenopathy. Does not bruise/bleed easily.  Psychiatric/Behavioral: Negative for suicidal ideas, behavioral problems, sleep disturbance, dysphoric mood and decreased concentration. The patient is not nervous/anxious.     Wt Readings from Last 3 Encounters:  04/20/13 152 lb (68.947 kg)  01/11/12 152 lb (68.947 kg)  09/20/11 148 lb (67.132 kg)   BP Readings from Last 3 Encounters:  04/20/13  160/90  01/11/12 120/80  09/20/11 150/108        Objective:   Physical Exam  Constitutional: She appears well-developed. No distress.  HENT:  Head: Normocephalic.  Right Ear: External ear normal.  Left Ear: External ear normal.  Nose: Nose normal.  Mouth/Throat: Oropharynx is clear and moist.  Eyes: Conjunctivae are normal. Pupils are equal, round, and reactive to light. Right eye exhibits no discharge. Left eye exhibits no discharge.  Neck: Normal range of motion. Neck supple. No JVD present. No tracheal deviation present. No thyromegaly present.  Cardiovascular: Normal rate, regular rhythm and normal heart sounds.   Pulmonary/Chest: No stridor. No respiratory distress. She has no wheezes.  Abdominal: Soft. Bowel sounds are normal. She exhibits no distension and no mass. There is no tenderness. There is no rebound and no guarding.  Musculoskeletal: She exhibits no edema and no tenderness.  Lymphadenopathy:    She has no cervical adenopathy.  Neurological: She displays normal reflexes. No cranial nerve deficit. She exhibits normal muscle tone. Coordination normal.  Skin: No rash noted. No erythema.  Psychiatric: She has a normal mood and affect. Her behavior is normal. Judgment and thought content normal.  B troch major sites are tender to palpation Str leg elev is neg B  Lab Results  Component Value Date   WBC 5.2 02/18/2011   HGB 14.1 02/18/2011   HCT 40.5 02/18/2011   PLT 222.0 02/18/2011   GLUCOSE 71 02/18/2011   CHOL 194 02/18/2011   TRIG 49.0 02/18/2011   HDL 43.90 02/18/2011   LDLDIRECT 182.6 03/17/2009   LDLCALC 140* 02/18/2011   ALT 12 02/18/2011   AST 15 02/18/2011   NA 140 02/18/2011   K 4.8 02/18/2011   CL 107 02/18/2011   CREATININE 0.7 02/18/2011   BUN 11 02/18/2011   CO2  28 02/18/2011   TSH 0.56 02/18/2011          Assessment & Plan:

## 2013-05-22 ENCOUNTER — Other Ambulatory Visit: Payer: Self-pay | Admitting: Internal Medicine

## 2013-06-11 ENCOUNTER — Ambulatory Visit (AMBULATORY_SURGERY_CENTER): Payer: BC Managed Care – PPO | Admitting: *Deleted

## 2013-06-11 ENCOUNTER — Encounter: Payer: Self-pay | Admitting: Gastroenterology

## 2013-06-11 VITALS — Ht 61.0 in | Wt 157.0 lb

## 2013-06-11 DIAGNOSIS — Z1211 Encounter for screening for malignant neoplasm of colon: Secondary | ICD-10-CM

## 2013-06-11 MED ORDER — NA SULFATE-K SULFATE-MG SULF 17.5-3.13-1.6 GM/177ML PO SOLN: ORAL | Status: DC

## 2013-06-22 ENCOUNTER — Encounter: Payer: BC Managed Care – PPO | Admitting: Gastroenterology

## 2013-07-09 ENCOUNTER — Telehealth: Payer: Self-pay | Admitting: Gastroenterology

## 2013-07-09 ENCOUNTER — Encounter: Payer: BC Managed Care – PPO | Admitting: Gastroenterology

## 2013-07-09 NOTE — Telephone Encounter (Signed)
ok 

## 2013-07-09 NOTE — Telephone Encounter (Signed)
Spoke with patient. Notified her that new prep instructions will be mailed to her today. She understands. Encouraged patient to read over them and call us back with any questions. She understands. Colon procedure on 08-15-13.

## 2013-08-01 ENCOUNTER — Other Ambulatory Visit: Payer: Self-pay

## 2013-08-01 DIAGNOSIS — Z1231 Encounter for screening mammogram for malignant neoplasm of breast: Secondary | ICD-10-CM

## 2013-08-15 ENCOUNTER — Encounter: Payer: Self-pay | Admitting: Gastroenterology

## 2013-08-15 ENCOUNTER — Ambulatory Visit (AMBULATORY_SURGERY_CENTER): Payer: BC Managed Care – PPO | Admitting: Gastroenterology

## 2013-08-15 VITALS — BP 137/84 | HR 67 | Temp 98.3°F | Resp 16 | Ht 61.0 in | Wt 157.0 lb

## 2013-08-15 DIAGNOSIS — Z1211 Encounter for screening for malignant neoplasm of colon: Secondary | ICD-10-CM

## 2013-08-15 DIAGNOSIS — K5730 Diverticulosis of large intestine without perforation or abscess without bleeding: Secondary | ICD-10-CM

## 2013-08-15 MED ORDER — SODIUM CHLORIDE 0.9 % IV SOLN: 500.0000 mL | INTRAVENOUS | Status: DC

## 2013-08-15 NOTE — Op Note (Signed)
[facility] [address] [place], [address]   COLONOSCOPY PROCEDURE REPORT  PATIENT: Cheryl Jones, Cheryl Jones  MR#: [id number] BIRTHDATE: August 16, 1961 , 52  yrs. old GENDER: Female ENDOSCOPIST: Louis Meckel, MD REFERRED MW:NUUV Buckner Malta, M.D. PROCEDURE DATE:  08/15/2013 PROCEDURE:   Colonoscopy, diagnostic First Screening Colonoscopy - Avg.  risk and is 50 yrs.  old or older Yes.  Prior Negative Screening - Now for repeat screening. N/A  History of Adenoma - Now for follow-up colonoscopy & has been > or = to 3 yrs.  N/A ASA CLASS:   Class II INDICATIONS:average risk screening. MEDICATIONS: MAC sedation, administered by CRNA and Propofol (Diprivan) 270 mg IV  DESCRIPTION OF PROCEDURE:   After the risks benefits and alternatives of the procedure were thoroughly explained, informed consent was obtained.  A digital rectal exam revealed no abnormalities of the rectum.   The LB [id number]  endoscope was introduced through the anus and advanced to the cecum, which was identified by both the appendix and ileocecal valve. No adverse events experienced.   The quality of the prep was excellent using Suprep  The instrument was then slowly withdrawn as the colon was fully examined.      COLON FINDINGS: Moderate diverticulosis was noted in the sigmoid colon.   The colon mucosa was otherwise normal.  Retroflexed views revealed no abnormalities. The time to cecum=3 minutes 43 seconds. Withdrawal time=6 minutes 35 seconds.  The scope was withdrawn and the procedure completed. COMPLICATIONS: There were no complications.  ENDOSCOPIC IMPRESSION: 1.   Moderate diverticulosis was noted in the sigmoid colon 2.   The colon mucosa was otherwise normal  RECOMMENDATIONS: Continue current colorectal screening recommendations for "routine risk" patients with a repeat colonoscopy in 10 years.   eSigned:  Louis Meckel, MD 08/15/2013 4:09 PM   cc:   PATIENT NAME:   Kida, Digiulio MR#: [id number]

## 2013-08-15 NOTE — Progress Notes (Signed)
Patient did not experience any of the following events: a burn prior to discharge; a fall within the facility; wrong site/side/patient/procedure/implant event; or a hospital transfer or hospital admission upon discharge from the facility. (G8907) Patient did not have preoperative order for IV antibiotic SSI prophylaxis. (G8918)  

## 2013-08-15 NOTE — Patient Instructions (Addendum)
YOU HAD AN ENDOSCOPIC PROCEDURE TODAY AT THE [facility]: Refer to the procedure report that was given to you for any specific questions about what was found during the examination.  If the procedure report does not answer your questions, please call your gastroenterologist to clarify.  If you requested that your care partner not be given the details of your procedure findings, then the procedure report has been included in a sealed envelope for you to review at your convenience later.  YOU SHOULD EXPECT: Some feelings of bloating in the abdomen. Passage of more gas than usual.  Walking can help get rid of the air that was put into your GI tract during the procedure and reduce the bloating. If you had a lower endoscopy (such as a colonoscopy or flexible sigmoidoscopy) you may notice spotting of blood in your stool or on the toilet paper. If you underwent a bowel prep for your procedure, then you may not have a normal bowel movement for a few days.  DIET: Your first meal following the procedure should be a light meal and then it is ok to progress to your normal diet.  A half-sandwich or bowl of soup is an example of a good first meal.  Heavy or fried foods are harder to digest and may make you feel nauseous or bloated.  Likewise meals heavy in dairy and vegetables can cause extra gas to form and this can also increase the bloating.  Drink plenty of fluids but you should avoid alcoholic beverages for 24 hours.  ACTIVITY: Your care partner should take you home directly after the procedure.  You should plan to take it easy, moving slowly for the rest of the day.  You can resume normal activity the day after the procedure however you should NOT DRIVE or use heavy machinery for 24 hours (because of the sedation medicines used during the test).    SYMPTOMS TO REPORT IMMEDIATELY: A gastroenterologist can be reached at any hour.  During normal business hours, 8:30 AM to 5:00 PM Monday through Friday,  call [phone].  After hours and on weekends, please call the GI answering service at [phone] who will take a message and have the physician on call contact you.   Following lower endoscopy (colonoscopy or flexible sigmoidoscopy):  Excessive amounts of blood in the stool  Significant tenderness or worsening of abdominal pains  Swelling of the abdomen that is new, acute  Fever of 100F or higher   FOLLOW UP:   Our staff will call the home number listed on your records the next business day following your procedure to check on you and address any questions or concerns that you may have at that time regarding the information given to you following your procedure. This is a courtesy call and so if there is no answer at the home number and we have not heard from you through the emergency physician on call, we will assume that you have returned to your regular daily activities without incident.  SIGNATURES/CONFIDENTIALITY: You and/or your care partner have signed paperwork which will be entered into your electronic medical record.  These signatures attest to the fact that that the information above on your After Visit Summary has been reviewed and is understood.  Full responsibility of the confidentiality of this discharge information lies with you and/or your care-partner.  Continue your normal medications  Please read over handouts about diverticulosis and high fiber diets  Follow up colonoscopy in 10 years

## 2013-08-15 NOTE — Progress Notes (Signed)
Report to pacu rn, vss, bbs=clear 

## 2013-08-15 NOTE — Progress Notes (Signed)
No egg or soy allergy. ewm No problems with sedation except lidocaine. emw

## 2013-08-16 ENCOUNTER — Telehealth: Payer: Self-pay | Admitting: *Deleted

## 2013-08-16 NOTE — Telephone Encounter (Signed)
  Follow up Call-  Call back number 08/15/2013  Post procedure Call Back phone  # [phone]  Permission to leave phone message Yes     Patient questions:  Do you have a fever, pain , or abdominal swelling? no Pain Score  0 *  Have you tolerated food without any problems? yes  Have you been able to return to your normal activities? yes  Do you have any questions about your discharge instructions: Diet   no Medications  no Follow up visit  no  Do you have questions or concerns about your Care? no  Actions: * If pain score is 4 or above: No action needed, pain <4.

## 2013-09-19 ENCOUNTER — Ambulatory Visit
Admission: RE | Admit: 2013-09-19 | Discharge: 2013-09-19 | Disposition: A | Discharge status: Home / Self Care | Payer: BC Managed Care – PPO | Source: Ambulatory Visit

## 2013-09-19 DIAGNOSIS — Z1231 Encounter for screening mammogram for malignant neoplasm of breast: Secondary | ICD-10-CM

## 2013-10-18 ENCOUNTER — Other Ambulatory Visit: Payer: Self-pay

## 2013-12-18 ENCOUNTER — Other Ambulatory Visit: Payer: Self-pay | Admitting: Internal Medicine

## 2014-03-27 ENCOUNTER — Other Ambulatory Visit: Payer: Self-pay | Admitting: *Deleted

## 2014-03-27 MED ORDER — VALSARTAN-HYDROCHLOROTHIAZIDE 320-25 MG PO TABS: 1.0000 | ORAL_TABLET | Freq: Every day | ORAL | Status: DC

## 2014-04-19 ENCOUNTER — Other Ambulatory Visit: Payer: Self-pay | Admitting: *Deleted

## 2014-04-19 ENCOUNTER — Other Ambulatory Visit (INDEPENDENT_AMBULATORY_CARE_PROVIDER_SITE_OTHER): Payer: BC Managed Care – PPO

## 2014-04-19 DIAGNOSIS — Z0000 Encounter for general adult medical examination without abnormal findings: Secondary | ICD-10-CM

## 2014-04-19 LAB — URINALYSIS, ROUTINE W REFLEX MICROSCOPIC
BILIRUBIN URINE: NEGATIVE
Hgb urine dipstick: NEGATIVE
Ketones, ur: NEGATIVE
LEUKOCYTES UA: NEGATIVE
Nitrite: NEGATIVE
PH: 6 (ref 5.0–8.0)
RBC / HPF: NONE SEEN (ref 0–?)
SPECIFIC GRAVITY, URINE: 1.02 (ref 1.000–1.030)
Total Protein, Urine: NEGATIVE
Urine Glucose: NEGATIVE
Urobilinogen, UA: 0.2 (ref 0.0–1.0)

## 2014-04-19 LAB — LIPID PANEL
CHOL/HDL RATIO: 5
Cholesterol: 226 mg/dL — ABNORMAL HIGH (ref 0–200)
HDL: 49.9 mg/dL (ref >39.00)
LDL Cholesterol: 149 mg/dL — ABNORMAL HIGH (ref 0–99)
TRIGLYCERIDES: 138 mg/dL (ref 0.0–149.0)
VLDL: 27.6 mg/dL (ref 0.0–40.0)

## 2014-04-19 LAB — CBC WITH DIFFERENTIAL/PLATELET
BASOS ABS: 0.1 10*3/uL (ref 0.0–0.1)
Basophils Relative: 0.6 % (ref 0.0–3.0)
Eosinophils Absolute: 0.3 10*3/uL (ref 0.0–0.7)
Eosinophils Relative: 3.9 % (ref 0.0–5.0)
HCT: 42.2 % (ref 36.0–46.0)
HEMOGLOBIN: 14.6 g/dL (ref 12.0–15.0)
LYMPHS ABS: 2.6 10*3/uL (ref 0.7–4.0)
LYMPHS PCT: 29 % (ref 12.0–46.0)
MCHC: 34.6 g/dL (ref 30.0–36.0)
MCV: 94 fl (ref 78.0–100.0)
Monocytes Absolute: 0.5 10*3/uL (ref 0.1–1.0)
Monocytes Relative: 5.5 % (ref 3.0–12.0)
NEUTROS ABS: 5.5 10*3/uL (ref 1.4–7.7)
Neutrophils Relative %: 61 % (ref 43.0–77.0)
Platelets: 224 10*3/uL (ref 150.0–400.0)
RBC: 4.49 Mil/uL (ref 3.87–5.11)
RDW: 13.5 % (ref 11.5–15.5)
WBC: 9 10*3/uL (ref 4.0–10.5)

## 2014-04-19 LAB — BASIC METABOLIC PANEL
BUN: 15 mg/dL (ref 6–23)
CALCIUM: 8.9 mg/dL (ref 8.4–10.5)
CO2: 27 mEq/L (ref 19–32)
Chloride: 105 mEq/L (ref 96–112)
Creatinine, Ser: 0.8 mg/dL (ref 0.4–1.2)
GFR: 78.87 mL/min (ref >60.00)
Glucose, Bld: 83 mg/dL (ref 70–99)
Potassium: 4 mEq/L (ref 3.5–5.1)
Sodium: 138 mEq/L (ref 135–145)

## 2014-04-19 LAB — HEPATIC FUNCTION PANEL
ALT: 14 U/L (ref 0–35)
AST: 16 U/L (ref 0–37)
Albumin: 3.6 g/dL (ref 3.5–5.2)
Alkaline Phosphatase: 52 U/L (ref 39–117)
Bilirubin, Direct: 0.1 mg/dL (ref 0.0–0.3)
Total Bilirubin: 0.9 mg/dL (ref 0.2–1.2)
Total Protein: 6.1 g/dL (ref 6.0–8.3)

## 2014-04-19 LAB — TSH: TSH: 1.04 u[IU]/mL (ref 0.35–4.50)

## 2014-04-23 ENCOUNTER — Ambulatory Visit (INDEPENDENT_AMBULATORY_CARE_PROVIDER_SITE_OTHER): Payer: BC Managed Care – PPO | Admitting: Internal Medicine

## 2014-04-23 ENCOUNTER — Encounter: Payer: Self-pay | Admitting: Internal Medicine

## 2014-04-23 VITALS — BP 140/80 | HR 76 | Temp 98.3°F | Resp 16 | Ht 61.0 in | Wt 155.0 lb

## 2014-04-23 DIAGNOSIS — I10 Essential (primary) hypertension: Secondary | ICD-10-CM

## 2014-04-23 DIAGNOSIS — Z0000 Encounter for general adult medical examination without abnormal findings: Secondary | ICD-10-CM

## 2014-04-23 MED ORDER — MELOXICAM 15 MG PO TABS: ORAL_TABLET | ORAL | Status: DC

## 2014-04-23 MED ORDER — VALSARTAN-HYDROCHLOROTHIAZIDE 320-25 MG PO TABS
1.0000 | ORAL_TABLET | Freq: Every day | ORAL | Status: DC
Start: 2014-04-23 — End: 2015-07-15

## 2014-04-23 NOTE — Assessment & Plan Note (Signed)
We discussed age appropriate health related issues, including available/recomended screening tests and vaccinations. We discussed a need for adhering to healthy diet and exercise. Labs/EKG were reviewed/ordered. All questions were answered. Zostavax discussed 

## 2014-04-23 NOTE — Progress Notes (Signed)
Pre visit review using our clinic review tool, if applicable. No additional management support is needed unless otherwise documented below in the visit note. 

## 2014-04-23 NOTE — Assessment & Plan Note (Signed)
Continue with current prescription therapy as reflected on the Med list.  

## 2014-04-23 NOTE — Patient Instructions (Signed)
MegaRed krill oil 

## 2014-04-23 NOTE — Progress Notes (Signed)
   Subjective:    HPI  The patient is here for a wellness exam. The patient has been doing well overall without major physical or psychological issues going on lately. LMP Feb 2015   Review of Systems  Constitutional: Negative for fever, chills, diaphoresis, activity change, appetite change, fatigue and unexpected weight change.  HENT: Negative for congestion, dental problem, ear pain, hearing loss, mouth sores, postnasal drip, sinus pressure, sneezing, sore throat and voice change.   Eyes: Negative for pain and visual disturbance.  Respiratory: Negative for cough, chest tightness, wheezing and stridor.   Cardiovascular: Negative for chest pain, palpitations and leg swelling.  Gastrointestinal: Negative for nausea, vomiting, abdominal pain, blood in stool, abdominal distention and rectal pain.  Genitourinary: Negative for dysuria, hematuria, decreased urine volume, vaginal bleeding, vaginal discharge, difficulty urinating, vaginal pain and menstrual problem.  Musculoskeletal: Negative for back pain, gait problem, joint swelling and neck pain.  Skin: Negative for color change, rash and wound.  Neurological: Negative for dizziness, tremors, syncope, speech difficulty and light-headedness.  Hematological: Negative for adenopathy.  Psychiatric/Behavioral: Negative for suicidal ideas, hallucinations, behavioral problems, confusion, sleep disturbance, dysphoric mood and decreased concentration. The patient is not hyperactive.        Objective:   Physical Exam  Constitutional: She appears well-developed. No distress.  HENT:  Head: Normocephalic.  Right Ear: External ear normal.  Left Ear: External ear normal.  Nose: Nose normal.  Mouth/Throat: Oropharynx is clear and moist.  Eyes: Conjunctivae are normal. Pupils are equal, round, and reactive to light. Right eye exhibits no discharge. Left eye exhibits no discharge.  Neck: Normal range of motion. Neck supple. No JVD present. No tracheal  deviation present. No thyromegaly present.  Cardiovascular: Normal rate, regular rhythm and normal heart sounds.   Pulmonary/Chest: No stridor. No respiratory distress. She has no wheezes.  Abdominal: Soft. Bowel sounds are normal. She exhibits no distension and no mass. There is no tenderness. There is no rebound and no guarding.  Musculoskeletal: She exhibits no edema and no tenderness.  Lymphadenopathy:    She has no cervical adenopathy.  Neurological: She displays normal reflexes. No cranial nerve deficit. She exhibits normal muscle tone. Coordination normal.  Skin: No rash noted. No erythema.  Psychiatric: She has a normal mood and affect. Her behavior is normal. Judgment and thought content normal.    Lab Results  Component Value Date   WBC 9.0 04/19/2014   HGB 14.6 04/19/2014   HCT 42.2 04/19/2014   PLT 224.0 04/19/2014   GLUCOSE 83 04/19/2014   CHOL 226* 04/19/2014   TRIG 138.0 04/19/2014   HDL 49.90 04/19/2014   LDLDIRECT 160.9 04/20/2013   LDLCALC 149* 04/19/2014   ALT 14 04/19/2014   AST 16 04/19/2014   NA 138 04/19/2014   K 4.0 04/19/2014   CL 105 04/19/2014   CREATININE 0.8 04/19/2014   BUN 15 04/19/2014   CO2 27 04/19/2014   TSH 1.04 04/19/2014         Assessment & Plan:

## 2014-04-24 ENCOUNTER — Telehealth: Payer: Self-pay | Admitting: Internal Medicine

## 2014-04-24 NOTE — Telephone Encounter (Signed)
Relevant patient education assigned to patient using Emmi. ° °

## 2014-06-25 ENCOUNTER — Other Ambulatory Visit: Payer: Self-pay | Admitting: Internal Medicine

## 2015-04-11 ENCOUNTER — Other Ambulatory Visit: Payer: Self-pay | Admitting: Internal Medicine

## 2015-04-15 ENCOUNTER — Other Ambulatory Visit: Payer: Self-pay | Admitting: Orthopedic Surgery

## 2015-04-15 DIAGNOSIS — R609 Edema, unspecified: Secondary | ICD-10-CM

## 2015-04-15 DIAGNOSIS — R52 Pain, unspecified: Secondary | ICD-10-CM

## 2015-04-30 ENCOUNTER — Ambulatory Visit
Admission: RE | Admit: 2015-04-30 | Discharge: 2015-04-30 | Disposition: A | Discharge status: Home / Self Care | Payer: BLUE CROSS/BLUE SHIELD | Source: Ambulatory Visit | Attending: Orthopedic Surgery | Admitting: Orthopedic Surgery

## 2015-04-30 DIAGNOSIS — R52 Pain, unspecified: Secondary | ICD-10-CM

## 2015-04-30 DIAGNOSIS — R609 Edema, unspecified: Secondary | ICD-10-CM

## 2015-05-10 ENCOUNTER — Ambulatory Visit (INDEPENDENT_AMBULATORY_CARE_PROVIDER_SITE_OTHER): Payer: BLUE CROSS/BLUE SHIELD | Admitting: Urgent Care

## 2015-05-10 VITALS — BP 140/90 | HR 71 | Temp 97.5°F | Ht 61.0 in | Wt 156.2 lb

## 2015-05-10 DIAGNOSIS — R102 Pelvic and perineal pain: Secondary | ICD-10-CM

## 2015-05-10 DIAGNOSIS — N390 Urinary tract infection, site not specified: Secondary | ICD-10-CM | POA: Insufficient documentation

## 2015-05-10 DIAGNOSIS — R319 Hematuria, unspecified: Secondary | ICD-10-CM | POA: Diagnosis not present

## 2015-05-10 DIAGNOSIS — R300 Dysuria: Secondary | ICD-10-CM

## 2015-05-10 DIAGNOSIS — R809 Proteinuria, unspecified: Secondary | ICD-10-CM

## 2015-05-10 LAB — POCT URINALYSIS DIPSTICK
Glucose, UA: NEGATIVE
Ketones, UA: NEGATIVE
Nitrite, UA: POSITIVE
PH UA: 6.5
Protein, UA: >=300
Spec Grav, UA: 1.01
Urobilinogen, UA: 0.2

## 2015-05-10 LAB — POCT UA - MICROSCOPIC ONLY
BACTERIA, U MICROSCOPIC: NEGATIVE
Casts, Ur, LPF, POC: NEGATIVE
Crystals, Ur, HPF, POC: NEGATIVE
Mucus, UA: NEGATIVE
YEAST UA: NEGATIVE

## 2015-05-10 MED ORDER — PHENAZOPYRIDINE HCL 200 MG PO TABS
200.0000 mg | ORAL_TABLET | Freq: Three times a day (TID) | ORAL | Status: DC | PRN
Start: 2015-05-10 — End: 2016-02-16

## 2015-05-10 MED ORDER — NITROFURANTOIN MONOHYD MACRO 100 MG PO CAPS: 100.0000 mg | ORAL_CAPSULE | Freq: Two times a day (BID) | ORAL | Status: AC

## 2015-05-10 NOTE — Patient Instructions (Signed)

## 2015-05-10 NOTE — Progress Notes (Signed)
MRN: [id number] DOB: Dec 15, 1960  Subjective:   Cheryl Jones is a 54 y.o. female presenting for chief complaint of Urinary Tract Infection  Reports onset of hematuria this am. Associated symptoms include pelvic pressure, urinary urgency, dysuria, fatigue. Has not tried any meidcations relief. Has not had any hematuria or UTIs previously. Denies fever, nausea, vomiting, abdominal pain, flank pain, history of renal stones, vaginal irritation, vaginal discharge. Denies smoking, occasional glass of wine. Denies any other aggravating or relieving factors, no other questions or concerns.  Cheryl Jones has a current medication list which includes the following prescription(s): vitamin d3, loratadine, meloxicam, nutritional supplements, probiotic product, valsartan-hydrochlorothiazide, and fluticasone. She is allergic to lidocaine.  Cheryl Jones  has a past medical history of DM (diabetes mellitus), gestational; Plantar fasciitis; and Hypertension. Also  has past surgical history that includes Inguinal hernia repair (2002); Dilation and curettage of uterus; and Hernia repair.  ROS As in subjective.  Objective:   Vitals: BP 140/90 mmHg  Pulse 71  Temp(Src) 97.5 F (36.4 C) (Oral)  Ht 5\' 1"  (1.549 m)  Wt 156 lb 4 oz (70.875 kg)  BMI 29.54 kg/m2  SpO2 99%  Physical Exam  Constitutional: She appears well-developed and well-nourished.  Cardiovascular: Normal rate, regular rhythm and intact distal pulses.  Exam reveals no gallop and no friction rub.   Murmur (Grade I/VI systolic murmur) heard. Pulmonary/Chest: No respiratory distress. She has no wheezes. She has no rales. She exhibits no tenderness.  Abdominal: Soft. Bowel sounds are normal. She exhibits no distension and no mass. There is tenderness (pelvic "pressure" on palpation).  Skin: Skin is warm and dry. No rash noted. No erythema. No pallor.   Results for orders placed or performed in visit on 05/10/15 (from the past 24 hour(s))  POCT UA -  Microscopic Only     Status: None   Collection Time: 05/10/15  2:29 PM  Result Value Ref Range   WBC, Ur, HPF, POC TNTC    RBC, urine, microscopic 7-10    Bacteria, U Microscopic neg    Mucus, UA neg    Epithelial cells, urine per micros 1-3    Crystals, Ur, HPF, POC neg    Casts, Ur, LPF, POC neg    Yeast, UA neg   POCT urinalysis dipstick     Status: None   Collection Time: 05/10/15  2:29 PM  Result Value Ref Range   Color, UA red-orange    Clarity, UA cloudy    Glucose, UA neg    Bilirubin, UA small    Ketones, UA neg    Spec Grav, UA 1.010    Blood, UA large    pH, UA 6.5    Protein, UA >=300    Urobilinogen, UA 0.2    Nitrite, UA positive    Leukocytes, UA small (1+)    Assessment and Plan :   1. Urinary tract infection with hematuria, site unspecified 2. Hematuria 3. Dysuria 4. Pelvic pain in female - Urine culture pending, will start Macrobid x10 days, offered pyridium for dysuria, advised aggressive hydration  5. Proteinuria - Counseled that patient follow up with her PCP regarding proteinuria, discussed differential and patient agreed to follow up with PCP  Wallis Bamberg, PA-C [facility] [phone] 05/10/2015 1:55 PM

## 2015-05-11 ENCOUNTER — Telehealth: Payer: Self-pay | Admitting: Urgent Care

## 2015-05-11 LAB — URINE CULTURE

## 2015-05-11 NOTE — Telephone Encounter (Signed)
Reported negative urine culture. Patient states that her symptoms are significantly improved. She will continue to take the antibiotic course however to completion. I advised that she follow-up regarding proteinuria and that this could be done with our office or with her new provider. Patient agreed.

## 2015-05-22 NOTE — Progress Notes (Signed)
  Medical screening examination/treatment/procedure(s) were performed by non-physician practitioner and as supervising physician I was immediately available for consultation/collaboration.     

## 2015-05-22 NOTE — Addendum Note (Signed)
Addended by: Brentlee Sciara S on: 05/22/2015 06:43 PM   Modules accepted: SmartSet  

## 2015-07-15 ENCOUNTER — Telehealth: Payer: Self-pay | Admitting: *Deleted

## 2015-07-15 MED ORDER — VALSARTAN-HYDROCHLOROTHIAZIDE 320-25 MG PO TABS
1.0000 | ORAL_TABLET | Freq: Every day | ORAL | Status: DC
Start: 2015-07-15 — End: 2015-08-08

## 2015-07-15 NOTE — Telephone Encounter (Signed)
Left msg on triage needing refill on her valsartan. Called pt back no answer LMOM she is due for yearly CPX can only send in 30 day until appt. Sent 30 to [facility].../lmb

## 2015-08-08 ENCOUNTER — Ambulatory Visit (INDEPENDENT_AMBULATORY_CARE_PROVIDER_SITE_OTHER): Payer: BLUE CROSS/BLUE SHIELD | Admitting: Internal Medicine

## 2015-08-08 ENCOUNTER — Other Ambulatory Visit (INDEPENDENT_AMBULATORY_CARE_PROVIDER_SITE_OTHER): Payer: BLUE CROSS/BLUE SHIELD

## 2015-08-08 ENCOUNTER — Encounter: Payer: Self-pay | Admitting: Internal Medicine

## 2015-08-08 VITALS — BP 138/80 | HR 70 | Ht 61.0 in | Wt 158.0 lb

## 2015-08-08 DIAGNOSIS — I10 Essential (primary) hypertension: Secondary | ICD-10-CM

## 2015-08-08 DIAGNOSIS — Z0000 Encounter for general adult medical examination without abnormal findings: Secondary | ICD-10-CM

## 2015-08-08 DIAGNOSIS — Z8632 Personal history of gestational diabetes: Secondary | ICD-10-CM | POA: Diagnosis not present

## 2015-08-08 DIAGNOSIS — R739 Hyperglycemia, unspecified: Secondary | ICD-10-CM

## 2015-08-08 DIAGNOSIS — Z23 Encounter for immunization: Secondary | ICD-10-CM

## 2015-08-08 LAB — LIPID PANEL
CHOL/HDL RATIO: 5
Cholesterol: 261 mg/dL — ABNORMAL HIGH (ref 0–200)
HDL: 48.3 mg/dL (ref >39.00)
LDL CALC: 183 mg/dL — AB (ref 0–99)
NONHDL: 212.29
TRIGLYCERIDES: 145 mg/dL (ref 0.0–149.0)
VLDL: 29 mg/dL (ref 0.0–40.0)

## 2015-08-08 LAB — BASIC METABOLIC PANEL
BUN: 14 mg/dL (ref 6–23)
CALCIUM: 9 mg/dL (ref 8.4–10.5)
CHLORIDE: 104 meq/L (ref 96–112)
CO2: 27 mEq/L (ref 19–32)
CREATININE: 0.74 mg/dL (ref 0.40–1.20)
GFR: 87.1 mL/min (ref >60.00)
Glucose, Bld: 91 mg/dL (ref 70–99)
Potassium: 4.2 mEq/L (ref 3.5–5.1)
Sodium: 138 mEq/L (ref 135–145)

## 2015-08-08 LAB — CBC WITH DIFFERENTIAL/PLATELET
BASOS ABS: 0.1 10*3/uL (ref 0.0–0.1)
BASOS PCT: 0.7 % (ref 0.0–3.0)
EOS ABS: 0.4 10*3/uL (ref 0.0–0.7)
Eosinophils Relative: 5.9 % — ABNORMAL HIGH (ref 0.0–5.0)
HEMATOCRIT: 42.3 % (ref 36.0–46.0)
HEMOGLOBIN: 14.5 g/dL (ref 12.0–15.0)
LYMPHS PCT: 38.2 % (ref 12.0–46.0)
Lymphs Abs: 2.8 10*3/uL (ref 0.7–4.0)
MCHC: 34.3 g/dL (ref 30.0–36.0)
MCV: 93.1 fl (ref 78.0–100.0)
MONO ABS: 0.5 10*3/uL (ref 0.1–1.0)
Monocytes Relative: 6.5 % (ref 3.0–12.0)
Neutro Abs: 3.6 10*3/uL (ref 1.4–7.7)
Neutrophils Relative %: 48.7 % (ref 43.0–77.0)
Platelets: 223 10*3/uL (ref 150.0–400.0)
RBC: 4.54 Mil/uL (ref 3.87–5.11)
RDW: 12.9 % (ref 11.5–15.5)
WBC: 7.4 10*3/uL (ref 4.0–10.5)

## 2015-08-08 LAB — URINALYSIS
BILIRUBIN URINE: NEGATIVE
HGB URINE DIPSTICK: NEGATIVE
Ketones, ur: NEGATIVE
Leukocytes, UA: NEGATIVE
NITRITE: NEGATIVE
PH: 6.5 (ref 5.0–8.0)
Specific Gravity, Urine: <=1.005 — AB (ref 1.000–1.030)
TOTAL PROTEIN, URINE-UPE24: NEGATIVE
URINE GLUCOSE: NEGATIVE
Urobilinogen, UA: 0.2 (ref 0.0–1.0)

## 2015-08-08 LAB — TSH: TSH: 0.6 u[IU]/mL (ref 0.35–4.50)

## 2015-08-08 LAB — VITAMIN D 25 HYDROXY (VIT D DEFICIENCY, FRACTURES): VITD: 40.7 ng/mL (ref 30.00–100.00)

## 2015-08-08 LAB — HEPATIC FUNCTION PANEL
ALK PHOS: 66 U/L (ref 39–117)
ALT: 14 U/L (ref 0–35)
AST: 16 U/L (ref 0–37)
Albumin: 4 g/dL (ref 3.5–5.2)
BILIRUBIN DIRECT: 0.1 mg/dL (ref 0.0–0.3)
BILIRUBIN TOTAL: 0.5 mg/dL (ref 0.2–1.2)
TOTAL PROTEIN: 6.4 g/dL (ref 6.0–8.3)

## 2015-08-08 MED ORDER — VALSARTAN-HYDROCHLOROTHIAZIDE 320-25 MG PO TABS: 1.0000 | ORAL_TABLET | Freq: Every day | ORAL | Status: DC

## 2015-08-08 NOTE — Progress Notes (Signed)
Subjective:  Patient ID: Cheryl Jones, female    DOB: [birth year]  Age: 54 y.o. MRN: [id number]  CC: Annual Exam   HPI Alvie Speltz presents for well exam.  Outpatient Prescriptions Prior to Visit  Medication Sig Dispense Refill  . Cholecalciferol (VITAMIN D3) 1000 UNITS CAPS Take by mouth.      . loratadine (CLARITIN) 10 MG tablet Take 10 mg by mouth daily.      . meloxicam (MOBIC) 15 MG tablet TAKE 1 TABLET (15 MG TOTAL) BY MOUTH DAILY AS NEEDED FOR PAIN. (Patient taking differently: TAKE 1/2 TABLET (7.5 MG TOTAL) BY MOUTH DAILY AS NEEDED FOR PAIN.) 90 tablet 0  . phenazopyridine (PYRIDIUM) 200 MG tablet Take 1 tablet (200 mg total) by mouth 3 (three) times daily as needed for pain. 30 tablet 0  . Probiotic Product (PROBIOTIC DAILY PO) Take by mouth daily.    . valsartan-hydrochlorothiazide (DIOVAN-HCT) 320-25 MG per tablet Take 1 tablet by mouth daily. 30 tablet 0  . fluticasone (FLONASE) 50 MCG/ACT nasal spray Place 1 spray into the nose daily. 16 g 2  . Nutritional Supplements (JUICE PLUS FIBRE PO) Take 2 tablets by mouth daily.     No facility-administered medications prior to visit.    ROS Review of Systems  Constitutional: Negative for chills, activity change, appetite change, fatigue and unexpected weight change.  HENT: Negative for congestion, mouth sores and sinus pressure.   Eyes: Negative for visual disturbance.  Respiratory: Negative for cough and chest tightness.   Gastrointestinal: Negative for nausea and abdominal pain.  Genitourinary: Negative for frequency, difficulty urinating and vaginal pain.  Musculoskeletal: Negative for back pain and gait problem.  Skin: Negative for pallor and rash.  Neurological: Negative for dizziness, tremors, weakness, numbness and headaches.  Psychiatric/Behavioral: Negative for suicidal ideas, confusion and sleep disturbance. The patient is not nervous/anxious.     Objective:  BP 138/80 mmHg  Pulse 70  Ht  (1.549 m)  Wt  158 lb (71.668 kg)  BMI 29.87 kg/m2  SpO2 97%  BP Readings from Last 3 Encounters:  08/08/15 138/80  05/10/15 140/90  04/23/14 140/80    Wt Readings from Last 3 Encounters:  08/08/15 158 lb (71.668 kg)  05/10/15 156 lb 4 oz (70.875 kg)  04/30/15 150 lb (68.04 kg)    Physical Exam  Constitutional: She appears well-developed. No distress.  HENT:  Head: Normocephalic.  Right Ear: External ear normal.  Left Ear: External ear normal.  Nose: Nose normal.  Mouth/Throat: Oropharynx is clear and moist.  Eyes: Conjunctivae are normal. Pupils are equal, round, and reactive to light. Right eye exhibits no discharge. Left eye exhibits no discharge.  Neck: Normal range of motion. Neck supple. No JVD present. No tracheal deviation present. No thyromegaly present.  Cardiovascular: Normal rate, regular rhythm and normal heart sounds.   Pulmonary/Chest: No stridor. No respiratory distress. She has no wheezes.  Abdominal: Soft. Bowel sounds are normal. She exhibits no distension and no mass. There is no tenderness. There is no rebound and no guarding.  Musculoskeletal: She exhibits no edema or tenderness.  Lymphadenopathy:    She has no cervical adenopathy.  Neurological: She displays normal reflexes. No cranial nerve deficit. She exhibits normal muscle tone. Coordination normal.  Skin: No rash noted. No erythema.  Psychiatric: She has a normal mood and affect. Her behavior is normal. Judgment and thought content normal.    Lab Results  Component Value Date   WBC 9.0 04/19/2014   HGB  14.6 04/19/2014   HCT 42.2 04/19/2014   PLT 224.0 04/19/2014   GLUCOSE 83 04/19/2014   CHOL 226* 04/19/2014   TRIG 138.0 04/19/2014   HDL 49.90 04/19/2014   LDLDIRECT 160.9 04/20/2013   LDLCALC 149* 04/19/2014   ALT 14 04/19/2014   AST 16 04/19/2014   NA 138 04/19/2014   K 4.0 04/19/2014   CL 105 04/19/2014   CREATININE 0.8 04/19/2014   BUN 15 04/19/2014   CO2 27 04/19/2014   TSH 1.04 04/19/2014     Mr Knee Left  Wo Contrast  04/30/2015   CLINICAL DATA:  Left knee pain and swelling.  Grinding for 9 months.  EXAM: MRI OF THE LEFT KNEE WITHOUT CONTRAST  TECHNIQUE: Multiplanar, multisequence MR imaging of the knee was performed. No intravenous contrast was administered.  COMPARISON:  None.  FINDINGS: MENISCI  Medial meniscus:  Intact.  Lateral meniscus:  Intact.  LIGAMENTS  Cruciates:  Intact ACL and PCL.  Collaterals: Medial collateral ligament is intact. Lateral collateral ligament complex is intact.  CARTILAGE  Patellofemoral: Cartilage fissuring with areas of full-thickness cartilage loss involving the lateral patellar facet and patellar apex. Small area of high-grade partial-thickness cartilage loss with subchondral reactive marrow change in the lateral trochlea.  Medial:  No focal chondral defect.  Lateral: High-grade partial-thickness cartilage loss of the medial aspect of the lateral tibial plateau. Partial thickness cartilage loss of the lateral femoral condyle.  Joint: No significant joint effusion. Mild edema in superolateral Hoffa's fat.  Popliteal Fossa: Small Baker cyst. Small loose body in the Baker cyst. Intact popliteus tendon.  Extensor Mechanism: Intact quadriceps and patellar tendon. Lateral patellar tilting. Shallow trochlea. Lateralization of the patellar tendon insertion in relation to the trochlear groove.  Bones:  No other marrow signal abnormality.  IMPRESSION: 1. Cartilage fissuring with areas of full-thickness cartilage loss involving the lateral patellar facet and patellar apex. Small area of high-grade partial-thickness cartilage loss with subchondral reactive marrow change in the lateral trochlea. 2. High-grade partial-thickness cartilage loss of the medial aspect of the lateral tibial plateau. Partial thickness cartilage loss of the lateral femoral condyle. 3. Lateral patellar tilting, shallow trochlea and lateralization of the patellar tendon insertion in relation to the  trochlear groove. 4. Small Baker cyst.   Electronically Signed   By: Elige Ko   On: 04/30/2015 13:29    Assessment & Plan:   There are no diagnoses linked to this encounter. I am having Ms. Tool maintain her loratadine, Vitamin D3, fluticasone, Nutritional Supplements (JUICE PLUS FIBRE PO), Probiotic Product (PROBIOTIC DAILY PO), meloxicam, phenazopyridine, and valsartan-hydrochlorothiazide.  No orders of the defined types were placed in this encounter.     Follow-up: No Follow-up on file.  Sonda Primes, MD

## 2015-08-08 NOTE — Assessment & Plan Note (Signed)
Nutrition consult

## 2015-08-08 NOTE — Assessment & Plan Note (Signed)
On Valsartan 

## 2015-08-08 NOTE — Assessment & Plan Note (Signed)
We discussed age appropriate health related issues, including available/recomended screening tests and vaccinations. We discussed a need for adhering to healthy diet and exercise. Labs/EKG were reviewed/ordered. All questions were answered.   

## 2015-08-08 NOTE — Progress Notes (Signed)
Pre visit review using our clinic review tool, if applicable. No additional management support is needed unless otherwise documented below in the visit note. 

## 2015-08-12 ENCOUNTER — Encounter: Payer: Self-pay | Admitting: Internal Medicine

## 2015-08-13 ENCOUNTER — Encounter: Payer: BLUE CROSS/BLUE SHIELD | Admitting: Internal Medicine

## 2015-08-23 ENCOUNTER — Ambulatory Visit (HOSPITAL_BASED_OUTPATIENT_CLINIC_OR_DEPARTMENT_OTHER)
Admission: RE | Admit: 2015-08-23 | Discharge: 2015-08-23 | Disposition: A | Discharge status: Home / Self Care | Payer: BLUE CROSS/BLUE SHIELD | Source: Ambulatory Visit | Attending: Family Medicine | Admitting: Family Medicine

## 2015-08-23 ENCOUNTER — Telehealth: Payer: Self-pay | Admitting: Family Medicine

## 2015-08-23 ENCOUNTER — Ambulatory Visit (INDEPENDENT_AMBULATORY_CARE_PROVIDER_SITE_OTHER): Payer: BLUE CROSS/BLUE SHIELD | Admitting: Family Medicine

## 2015-08-23 VITALS — BP 138/80 | HR 85 | Temp 98.0°F | Resp 16 | Ht 62.0 in | Wt 157.0 lb

## 2015-08-23 DIAGNOSIS — R1031 Right lower quadrant pain: Secondary | ICD-10-CM | POA: Diagnosis not present

## 2015-08-23 DIAGNOSIS — K449 Diaphragmatic hernia without obstruction or gangrene: Secondary | ICD-10-CM | POA: Insufficient documentation

## 2015-08-23 DIAGNOSIS — N200 Calculus of kidney: Secondary | ICD-10-CM | POA: Diagnosis not present

## 2015-08-23 LAB — POCT CBC
Granulocyte percent: 69.9 %G (ref 37–80)
HCT, POC: 41.1 % (ref 37.7–47.9)
Hemoglobin: 13.7 g/dL (ref 12.2–16.2)
Lymph, poc: 2.1 (ref 0.6–3.4)
MCH, POC: 30.5 pg (ref 27–31.2)
MCHC: 33.3 g/dL (ref 31.8–35.4)
MCV: 91.4 fL (ref 80–97)
MID (cbc): 0.5 (ref 0–0.9)
MPV: 8.3 fL (ref 0–99.8)
POC Granulocyte: 6.1 (ref 2–6.9)
POC LYMPH PERCENT: 23.9 %L (ref 10–50)
POC MID %: 6.2 %M (ref 0–12)
Platelet Count, POC: 235 10*3/uL (ref 142–424)
RBC: 4.5 M/uL (ref 4.04–5.48)
RDW, POC: 13.5 %
WBC: 8.7 10*3/uL (ref 4.6–10.2)

## 2015-08-23 LAB — POCT UA - MICROSCOPIC ONLY
Casts, Ur, LPF, POC: NEGATIVE
Crystals, Ur, HPF, POC: NEGATIVE
Yeast, UA: NEGATIVE

## 2015-08-23 LAB — POCT URINALYSIS DIPSTICK
Bilirubin, UA: NEGATIVE
Blood, UA: NEGATIVE
Glucose, UA: NEGATIVE
Ketones, UA: NEGATIVE
Leukocytes, UA: NEGATIVE
Nitrite, UA: NEGATIVE
Protein, UA: NEGATIVE
Spec Grav, UA: 1.02
Urobilinogen, UA: 0.2
pH, UA: 7

## 2015-08-23 MED ORDER — KETOROLAC TROMETHAMINE 10 MG PO TABS: 10.0000 mg | ORAL_TABLET | Freq: Four times a day (QID) | ORAL | Status: DC | PRN

## 2015-08-23 MED ORDER — IOHEXOL 300 MG/ML  SOLN
100.0000 mL | Freq: Once | INTRAMUSCULAR | Status: AC | PRN
  Administered 2015-08-23: 100 mL via INTRAVENOUS

## 2015-08-23 MED ORDER — KETOROLAC TROMETHAMINE 60 MG/2ML IM SOLN
60.0000 mg | Freq: Once | INTRAMUSCULAR | Status: AC
  Administered 2015-08-23: 60 mg via INTRAMUSCULAR

## 2015-08-23 MED ORDER — IOHEXOL 300 MG/ML  SOLN
50.0000 mL | Freq: Once | INTRAMUSCULAR | Status: AC | PRN
  Administered 2015-08-23: 50 mL via ORAL

## 2015-08-23 NOTE — Telephone Encounter (Signed)
Pt states she was advised to start a po toradol for her kidney stones tonight but her pharmacy is closed so I sent rx into [facility].

## 2015-08-23 NOTE — Patient Instructions (Signed)
The urine test does not show a kidney stone. Because the diagnosis is still unconfirmed, we need to proceed with a CAT scan of the abdomen today.

## 2015-08-23 NOTE — Progress Notes (Signed)
@UMFCLOGO @  This chart was scribed for Elvina Sidle, MD by Andrew Au, ED Scribe. This patient was seen in room 5 and the patient's care was started at 3:51 PM.  Patient ID: Cheryl Jones MRN: [id number], DOB: 1961/07/28, 54 y.o. Date of Encounter: 08/23/2015, 3:51 PM  Primary Physician: Sonda Primes, MD  Chief Complaint:  Chief Complaint  Patient presents with   pain on the right side    x today    HPI: 53 y.o. year old female with history below presents with abrupt onset of localized, constant right lower abdominal pain that started at 1:30 pm. Pt denies pain radiating to right back. She reports exacerbation of pain with deep breaths, changing positions  and with jumping. She is most comfortable standing and pacing. She denies hx of kidney stones. She denies emesis.   Past Medical History  Diagnosis Date   DM (diabetes mellitus), gestational    Plantar fasciitis    Hypertension      Home Meds: Prior to Admission medications   Medication Sig Start Date End Date Taking? Authorizing Provider  Cholecalciferol (VITAMIN D3) 1000 UNITS CAPS Take by mouth.     Yes Historical Provider, MD  loratadine (CLARITIN) 10 MG tablet Take 10 mg by mouth daily.     Yes Historical Provider, MD  meloxicam (MOBIC) 15 MG tablet TAKE 1 TABLET (15 MG TOTAL) BY MOUTH DAILY AS NEEDED FOR PAIN. Patient taking differently: TAKE 1/2 TABLET (7.5 MG TOTAL) BY MOUTH DAILY AS NEEDED FOR PAIN. 04/14/15  Yes Aleksei Plotnikov V, MD  Nutritional Supplements (JUICE PLUS FIBRE PO) Take 2 tablets by mouth daily.   Yes Historical Provider, MD  Probiotic Product (PROBIOTIC DAILY PO) Take by mouth daily.   Yes Historical Provider, MD  valsartan-hydrochlorothiazide (DIOVAN-HCT) 320-25 MG per tablet Take 1 tablet by mouth daily. 08/08/15  Yes Aleksei Plotnikov V, MD  fluticasone (FLONASE) 50 MCG/ACT nasal spray Place 1 spray into the nose daily. 06/07/11 06/06/12  Aleksei Plotnikov V, MD  phenazopyridine (PYRIDIUM) 200  MG tablet Take 1 tablet (200 mg total) by mouth 3 (three) times daily as needed for pain. Patient not taking: Reported on 08/23/2015 05/10/15   Wallis Bamberg, PA-C    Allergies: No Known Allergies  Social History   Social History   Marital Status: Married    Spouse Name: N/A   Number of Children: N/A   Years of Education: N/A   Occupational History   housewife    Social History Main Topics   Smoking status: Never Smoker    Smokeless tobacco: Never Used   Alcohol Use: 1.8 oz/week    3 Glasses of wine per week   Drug Use: No   Sexual Activity: Yes   Other Topics Concern   Not on file   Social History Narrative   Married, 2 sons   Regular exercise-yoga     Review of Systems: Constitutional: negative for chills, fever, night sweats, weight changes, or fatigue  HEENT: negative for vision changes, hearing loss, congestion, rhinorrhea, ST, epistaxis, or sinus pressure Cardiovascular: negative for chest pain or palpitations Respiratory: negative for hemoptysis, wheezing, shortness of breath, or cough Abdominal: negative for  nausea, vomiting, diarrhea, or constipation. Positive abdominal pain. Dermatological: negative for rash Neurologic: negative for headache, dizziness, or syncope All other systems reviewed and are otherwise negative with the exception to those above and in the HPI.   Physical Exam: Blood pressure 138/80, pulse 85, temperature 98 F (36.7 C), temperature source Oral, resp. rate  16, height  (1.575 m), weight 157 lb (71.215 kg), SpO2 98 %., Body mass index is 28.71 kg/(m^2). General: Well developed, well nourished, in no acute distress. Head: Normocephalic, atraumatic, eyes without discharge, sclera non-icteric, nares are without discharge. Bilateral auditory canals clear, TM's are without perforation, pearly grey and translucent with reflective cone of light bilaterally. Oral cavity moist, posterior pharynx without exudate, erythema, peritonsillar  abscess, or post nasal drip.  Neck: Supple. No thyromegaly. Full ROM. No lymphadenopathy. Lungs: Clear bilaterally to auscultation without wheezes, rales, or rhonchi. Breathing is unlabored. Heart: RRR with S1 S2. No murmurs, rubs, or gallops appreciated. Abdomen: Soft, tenderness in RLQ with deep palpation just proximal to McBurney's point., non-distended with normoactive bowel sounds. No hepatomegaly. No rebound/guarding. No obvious abdominal masses. Msk:  Strength and tone normal for age. Extremities/Skin: Warm and dry. No clubbing or cyanosis. No edema. No rashes or suspicious lesions. Neuro: Alert and oriented X 3. Moves all extremities spontaneously. Gait is normal. CNII-XII grossly in tact. Psych:  Responds to questions appropriately with a normal affect.   Labs: Results for orders placed or performed in visit on 08/23/15  POCT CBC  Result Value Ref Range   WBC 8.7 4.6 - 10.2 K/uL   Lymph, poc 2.1 0.6 - 3.4   POC LYMPH PERCENT 23.9 10 - 50 %L   MID (cbc) 0.5 0 - 0.9   POC MID % 6.2 0 - 12 %M   POC Granulocyte 6.1 2 - 6.9   Granulocyte percent 69.9 37 - 80 %G   RBC 4.50 4.04 - 5.48 M/uL   Hemoglobin 13.7 12.2 - 16.2 g/dL   HCT, POC 16.1 09.6 - 47.9 %   MCV 91.4 80 - 97 fL   MCH, POC 30.5 27 - 31.2 pg   MCHC 33.3 31.8 - 35.4 g/dL   RDW, POC 04.5 %   Platelet Count, POC 235 142 - 424 K/uL   MPV 8.3 0 - 99.8 fL  POCT urinalysis dipstick  Result Value Ref Range   Color, UA yellow    Clarity, UA clear    Glucose, UA negative    Bilirubin, UA negative    Ketones, UA negative    Spec Grav, UA 1.020    Blood, UA negative    pH, UA 7.0    Protein, UA negative    Urobilinogen, UA 0.2    Nitrite, UA negative    Leukocytes, UA Negative Negative  POCT UA - Microscopic Only  Result Value Ref Range   WBC, Ur, HPF, POC 0-1    RBC, urine, microscopic 0-1    Bacteria, U Microscopic 1+    Mucus, UA trace    Epithelial cells, urine per micros 5-10    Crystals, Ur, HPF, POC  negative    Casts, Ur, LPF, POC negative    Yeast, UA negative       ASSESSMENT AND PLAN:  54 y.o. year old female with acute RLQ pain, which originally seemed most consistent with a kidney stone but without the hematuria, diagnosis is still unconfirmed. Therefore a CT with contrast is indicated.   Signed, Elvina Sidle, MD 08/23/2015 3:51 PM

## 2015-08-25 ENCOUNTER — Other Ambulatory Visit: Payer: Self-pay | Admitting: Internal Medicine

## 2015-08-25 ENCOUNTER — Encounter: Payer: Self-pay | Admitting: Internal Medicine

## 2015-08-25 DIAGNOSIS — N23 Unspecified renal colic: Secondary | ICD-10-CM

## 2015-09-19 ENCOUNTER — Encounter: Payer: BLUE CROSS/BLUE SHIELD | Attending: Internal Medicine | Admitting: Skilled Nursing Facility1

## 2015-09-19 ENCOUNTER — Encounter: Payer: Self-pay | Admitting: Skilled Nursing Facility1

## 2015-09-19 VITALS — Ht 61.0 in | Wt 154.0 lb

## 2015-09-19 DIAGNOSIS — Z6829 Body mass index (BMI) 29.0-29.9, adult: Secondary | ICD-10-CM | POA: Insufficient documentation

## 2015-09-19 DIAGNOSIS — E669 Obesity, unspecified: Secondary | ICD-10-CM | POA: Diagnosis present

## 2015-09-19 DIAGNOSIS — Z713 Dietary counseling and surveillance: Secondary | ICD-10-CM | POA: Diagnosis not present

## 2015-09-19 NOTE — Progress Notes (Signed)
  Medical Nutrition Therapy:  Appt start time: 1030 end time:  1130.   Assessment:  Primary concerns today: referred for obesity. Pt is taking many different supplements. Pt states she wants to be proactive because she had gestational diabetes which was controlled via diet. Pt states she has had 2 pregnancies and only had gestational with one baby and he is 53 years old. Pt states she has a Hiatal hernia and a kidney stone ([place] cause). Pt complains of no GI symptoms or diabetes like symptoms. Pt does complain of gas which may be caused by her supplements.  Pts labs: cholesterol 261 and LDL 183.   Preferred Learning Style:   Auditory  Learning Readiness:  Contemplating  MEDICATIONS: See List   DIETARY INTAKE:  Usual eating pattern includes 3 meals and 3 snacks per day.  Everyday foods include none stated.  Avoided foods include gluten and dairy.    24-hr recall:  B ( AM): gluten free protein bar Snk ( AM): welches fruit snacks L ( PM): salad with protein Snk ( PM): nuts-----fruit----snake D ( PM): sweet potato, pork, and beans with cesar salad Snk ( PM): nuts Beverages: coffee, water, half and half tea, wine, beer  Usual physical activity: 2-3 days a week 30 minute walk, butt building exercises 2-3 days a week  Estimated energy needs: 1600 calories 180 g carbohydrates 120 g protein 44 g fat  Progress Towards Goal(s):  In progress.   Nutritional Diagnosis:  NB-1.1 Food and nutrition-related knowledge deficit As related to no prior nutrition information from a nutrtion professional.  As evidenced by supplement use, pt report, 24 hr recall..    Intervention:  Nutrition counseling for obesity. Dietitian educated th ept on balanced/varied meals, eating throughout the day, honoring her body, and daily physical activity. Goals: -Honor your body with hunger and fullness cues -Be physically active every day -Supplements are not necessary  -Balanced and varied meals and  snacks every day  Teaching Method Utilized:  Visual Auditory  Handouts given during visit include:  Snack sheet  MyPlate  Barriers to learning/adherence to lifestyle change: eating like her kids  Demonstrated degree of understanding via:  Teach Back   Monitoring/Evaluation:  Dietary intake, exercise, cholesteral, and body weight prn.

## 2015-09-19 NOTE — Patient Instructions (Signed)
-  Honor your body with hunger and fullness cues -Be physically active every day -Supplements are not necessary  -Balanced and varied meals and snacks every day

## 2015-10-24 ENCOUNTER — Ambulatory Visit (INDEPENDENT_AMBULATORY_CARE_PROVIDER_SITE_OTHER): Payer: BLUE CROSS/BLUE SHIELD | Admitting: Internal Medicine

## 2015-10-24 ENCOUNTER — Encounter: Payer: Self-pay | Admitting: Internal Medicine

## 2015-10-24 VITALS — BP 110/70 | HR 71 | Temp 97.9°F | Wt 157.0 lb

## 2015-10-24 DIAGNOSIS — J45909 Unspecified asthma, uncomplicated: Secondary | ICD-10-CM | POA: Insufficient documentation

## 2015-10-24 DIAGNOSIS — J4520 Mild intermittent asthma, uncomplicated: Secondary | ICD-10-CM

## 2015-10-24 MED ORDER — HYDROCOD POLST-CPM POLST ER 10-8 MG/5ML PO SUER: 5.0000 mL | Freq: Two times a day (BID) | ORAL | Status: DC | PRN

## 2015-10-24 MED ORDER — FLUTICASONE FUROATE-VILANTEROL 100-25 MCG/INH IN AEPB: 1.0000 | INHALATION_SPRAY | Freq: Every day | RESPIRATORY_TRACT | Status: DC

## 2015-10-24 NOTE — Progress Notes (Signed)
Pre visit review using our clinic review tool, if applicable. No additional management support is needed unless otherwise documented below in the visit note. 

## 2015-10-24 NOTE — Progress Notes (Signed)
Subjective:  Patient ID: Cheryl Jones, female    DOB: May 09, 1961  Age: 54 y.o. MRN: [id number]  CC: No chief complaint on file.   HPI Viveka Dillavou presents for cough x 2.5 weeks - dry. H/o asthma  Outpatient Prescriptions Prior to Visit  Medication Sig Dispense Refill  . Cholecalciferol (VITAMIN D3) 1000 UNITS CAPS Take by mouth.      Marland Kitchen ketorolac (TORADOL) 10 MG tablet Take 1 tablet (10 mg total) by mouth every 6 (six) hours as needed. 20 tablet 0  . loratadine (CLARITIN) 10 MG tablet Take 10 mg by mouth daily.      . meloxicam (MOBIC) 15 MG tablet TAKE 1 TABLET (15 MG TOTAL) BY MOUTH DAILY AS NEEDED FOR PAIN. (Patient taking differently: TAKE 1/2 TABLET (7.5 MG TOTAL) BY MOUTH DAILY AS NEEDED FOR PAIN.) 90 tablet 0  . Nutritional Supplements (JUICE PLUS FIBRE PO) Take 2 tablets by mouth daily.    . Probiotic Product (PROBIOTIC DAILY PO) Take by mouth daily.    . valsartan-hydrochlorothiazide (DIOVAN-HCT) 320-25 MG per tablet Take 1 tablet by mouth daily. 90 tablet 3  . fluticasone (FLONASE) 50 MCG/ACT nasal spray Place 1 spray into the nose daily. 16 g 2  . phenazopyridine (PYRIDIUM) 200 MG tablet Take 1 tablet (200 mg total) by mouth 3 (three) times daily as needed for pain. (Patient not taking: Reported on 10/24/2015) 30 tablet 0   No facility-administered medications prior to visit.    ROS Review of Systems  Constitutional: Negative for chills, activity change, appetite change, fatigue and unexpected weight change.  HENT: Negative for congestion, mouth sores and sinus pressure.   Eyes: Negative for visual disturbance.  Respiratory: Positive for cough. Negative for chest tightness, shortness of breath and wheezing.   Gastrointestinal: Negative for nausea and abdominal pain.  Genitourinary: Negative for frequency, difficulty urinating and vaginal pain.  Musculoskeletal: Negative for back pain and gait problem.  Skin: Negative for pallor and rash.  Neurological: Negative for  dizziness, tremors, weakness, numbness and headaches.  Psychiatric/Behavioral: Negative for confusion and sleep disturbance. The patient is not nervous/anxious.     Objective:  BP 110/70 mmHg  Pulse 71  Temp(Src) 97.9 F (36.6 C) (Oral)  Wt 157 lb (71.215 kg)  SpO2 98%  BP Readings from Last 3 Encounters:  10/24/15 110/70  08/23/15 138/80  08/08/15 138/80    Wt Readings from Last 3 Encounters:  10/24/15 157 lb (71.215 kg)  09/19/15 154 lb (69.854 kg)  08/23/15 157 lb (71.215 kg)    Physical Exam  Constitutional: She appears well-developed. No distress.  HENT:  Head: Normocephalic.  Right Ear: External ear normal.  Left Ear: External ear normal.  Nose: Nose normal.  Eyes: Conjunctivae are normal. Pupils are equal, round, and reactive to light. Right eye exhibits no discharge. Left eye exhibits no discharge.  Neck: Normal range of motion. Neck supple. No JVD present. No tracheal deviation present. No thyromegaly present.  Cardiovascular: Normal rate, regular rhythm and normal heart sounds.   Pulmonary/Chest: No stridor. No respiratory distress. She has no wheezes.  Abdominal: Soft. Bowel sounds are normal. She exhibits no distension and no mass. There is no tenderness. There is no rebound and no guarding.  Musculoskeletal: She exhibits no edema or tenderness.  Lymphadenopathy:    She has no cervical adenopathy.  Neurological: She displays normal reflexes. No cranial nerve deficit. She exhibits normal muscle tone. Coordination normal.  Skin: No rash noted. No erythema.  Psychiatric: She has  a normal mood and affect. Her behavior is normal. Judgment and thought content normal.  eryth throat  Lab Results  Component Value Date   WBC 8.7 08/23/2015   HGB 13.7 08/23/2015   HCT 41.1 08/23/2015   PLT 223.0 08/08/2015   GLUCOSE 91 08/08/2015   CHOL 261* 08/08/2015   TRIG 145.0 08/08/2015   HDL 48.30 08/08/2015   LDLDIRECT 160.9 04/20/2013   LDLCALC 183* 08/08/2015   ALT  14 08/08/2015   AST 16 08/08/2015   NA 138 08/08/2015   K 4.2 08/08/2015   CL 104 08/08/2015   CREATININE 0.74 08/08/2015   BUN 14 08/08/2015   CO2 27 08/08/2015   TSH 0.60 08/08/2015   I personally provided Breo inhaler use teaching. After the teaching patient was able to demonstrate it's use effectively. All questions were answered  Ct Abdomen Pelvis W Contrast  08/23/2015  CLINICAL DATA:  Acute right lower quadrant abdominal pain. EXAM: CT ABDOMEN AND PELVIS WITH CONTRAST TECHNIQUE: Multidetector CT imaging of the abdomen and pelvis was performed using the standard protocol following bolus administration of intravenous contrast. CONTRAST:  50mL OMNIPAQUE IOHEXOL 300 MG/ML SOLN, OMNIPAQUE IOHEXOL 300 MG/ML SOLN COMPARISON:  None. FINDINGS: Visualized lung bases appear normal. No significant osseous abnormality is noted. Large hiatal hernia is noted. No gallstones are noted. The liver, spleen and pancreas appear normal. Adrenal glands appear normal. Nonobstructive calculus is noted in right kidney. No hydronephrosis or renal obstruction is noted. The appendix appears normal. There is no evidence of bowel obstruction. Uterus and ovaries appear normal. Mild sigmoid diverticulosis is noted without inflammation. No abnormal fluid collection is noted. Urinary bladder appears normal. No significant adenopathy is noted. Status post left inguinal hernia repair. IMPRESSION: Large hiatal hernia is noted. Nonobstructive right renal calculus is noted. No hydronephrosis is noted. The appendix appears normal. No acute abnormality seen in the abdomen or pelvis. Electronically Signed   By: Lupita Raider, M.D.   On: 08/23/2015 20:01    Assessment & Plan:   Diagnoses and all orders for this visit:  Asthmatic bronchitis, mild intermittent, uncomplicated  Other orders -     Fluticasone Furoate-Vilanterol (BREO ELLIPTA) 100-25 MCG/INH AEPB; Inhale 1 Act into the lungs daily. -      chlorpheniramine-HYDROcodone (TUSSIONEX PENNKINETIC ER) 10-8 MG/5ML SUER; Take 5 mLs by mouth every 12 (twelve) hours as needed for cough.  I am having Ms. Maute start on Fluticasone Furoate-Vilanterol and chlorpheniramine-HYDROcodone. I am also having her maintain her loratadine, Vitamin D3, fluticasone, Nutritional Supplements (JUICE PLUS FIBRE PO), Probiotic Product (PROBIOTIC DAILY PO), meloxicam, phenazopyridine, valsartan-hydrochlorothiazide, and ketorolac.  Meds ordered this encounter  Medications  . Fluticasone Furoate-Vilanterol (BREO ELLIPTA) 100-25 MCG/INH AEPB    Sig: Inhale 1 Act into the lungs daily.    Dispense:  1 each    Refill:  5  . chlorpheniramine-HYDROcodone (TUSSIONEX PENNKINETIC ER) 10-8 MG/5ML SUER    Sig: Take 5 mLs by mouth every 12 (twelve) hours as needed for cough.    Dispense:  115 mL    Refill:  0     Follow-up: No Follow-up on file.  Sonda Primes, MD

## 2015-12-02 ENCOUNTER — Other Ambulatory Visit: Payer: Self-pay | Admitting: Internal Medicine

## 2016-01-30 ENCOUNTER — Encounter (HOSPITAL_BASED_OUTPATIENT_CLINIC_OR_DEPARTMENT_OTHER): Payer: Self-pay | Admitting: *Deleted

## 2016-01-30 ENCOUNTER — Emergency Department (HOSPITAL_BASED_OUTPATIENT_CLINIC_OR_DEPARTMENT_OTHER)
Admission: EM | Admit: 2016-01-30 | Discharge: 2016-01-30 | Disposition: A | Discharge status: Home / Self Care | Payer: BLUE CROSS/BLUE SHIELD | Attending: Emergency Medicine | Admitting: Emergency Medicine

## 2016-01-30 ENCOUNTER — Telehealth: Payer: Self-pay | Admitting: Internal Medicine

## 2016-01-30 DIAGNOSIS — Z79899 Other long term (current) drug therapy: Secondary | ICD-10-CM | POA: Insufficient documentation

## 2016-01-30 DIAGNOSIS — Z8719 Personal history of other diseases of the digestive system: Secondary | ICD-10-CM | POA: Diagnosis not present

## 2016-01-30 DIAGNOSIS — R109 Unspecified abdominal pain: Secondary | ICD-10-CM | POA: Diagnosis not present

## 2016-01-30 DIAGNOSIS — Z87442 Personal history of urinary calculi: Secondary | ICD-10-CM | POA: Insufficient documentation

## 2016-01-30 DIAGNOSIS — E119 Type 2 diabetes mellitus without complications: Secondary | ICD-10-CM | POA: Insufficient documentation

## 2016-01-30 DIAGNOSIS — Z8739 Personal history of other diseases of the musculoskeletal system and connective tissue: Secondary | ICD-10-CM | POA: Diagnosis not present

## 2016-01-30 DIAGNOSIS — I10 Essential (primary) hypertension: Secondary | ICD-10-CM | POA: Diagnosis not present

## 2016-01-30 HISTORY — DX: Calculus of kidney: N20.0

## 2016-01-30 LAB — URINALYSIS, ROUTINE W REFLEX MICROSCOPIC
Bilirubin Urine: NEGATIVE
GLUCOSE, UA: NEGATIVE mg/dL
HGB URINE DIPSTICK: NEGATIVE
Ketones, ur: 15 mg/dL — AB
Leukocytes, UA: NEGATIVE
Nitrite: NEGATIVE
PH: 5.5 (ref 5.0–8.0)
PROTEIN: NEGATIVE mg/dL
Specific Gravity, Urine: 1.029 (ref 1.005–1.030)

## 2016-01-30 LAB — CBC WITH DIFFERENTIAL/PLATELET
BASOS ABS: 0 10*3/uL (ref 0.0–0.1)
Basophils Relative: 0 %
EOS PCT: 1 %
Eosinophils Absolute: 0.1 10*3/uL (ref 0.0–0.7)
HEMATOCRIT: 40.8 % (ref 36.0–46.0)
Hemoglobin: 14.1 g/dL (ref 12.0–15.0)
LYMPHS PCT: 16 %
Lymphs Abs: 1.7 10*3/uL (ref 0.7–4.0)
MCH: 31.5 pg (ref 26.0–34.0)
MCHC: 34.6 g/dL (ref 30.0–36.0)
MCV: 91.3 fL (ref 78.0–100.0)
MONO ABS: 0.2 10*3/uL (ref 0.1–1.0)
MONOS PCT: 2 %
Neutro Abs: 8.8 10*3/uL — ABNORMAL HIGH (ref 1.7–7.7)
Neutrophils Relative %: 81 %
PLATELETS: 225 10*3/uL (ref 150–400)
RBC: 4.47 MIL/uL (ref 3.87–5.11)
RDW: 12.2 % (ref 11.5–15.5)
WBC: 10.7 10*3/uL — ABNORMAL HIGH (ref 4.0–10.5)

## 2016-01-30 LAB — COMPREHENSIVE METABOLIC PANEL
ALT: 13 U/L — ABNORMAL LOW (ref 14–54)
ANION GAP: 9 (ref 5–15)
AST: 17 U/L (ref 15–41)
Albumin: 3.9 g/dL (ref 3.5–5.0)
Alkaline Phosphatase: 56 U/L (ref 38–126)
BILIRUBIN TOTAL: 0.6 mg/dL (ref 0.3–1.2)
BUN: 15 mg/dL (ref 6–20)
CHLORIDE: 105 mmol/L (ref 101–111)
CO2: 25 mmol/L (ref 22–32)
Calcium: 8.9 mg/dL (ref 8.9–10.3)
Creatinine, Ser: 0.73 mg/dL (ref 0.44–1.00)
Glucose, Bld: 107 mg/dL — ABNORMAL HIGH (ref 65–99)
POTASSIUM: 4.6 mmol/L (ref 3.5–5.1)
Sodium: 139 mmol/L (ref 135–145)
TOTAL PROTEIN: 6.3 g/dL — AB (ref 6.5–8.1)

## 2016-01-30 LAB — LIPASE, BLOOD: LIPASE: 25 U/L (ref 11–51)

## 2016-01-30 MED ORDER — OXYCODONE-ACETAMINOPHEN 5-325 MG PO TABS: 1.0000 | ORAL_TABLET | ORAL | Status: DC | PRN

## 2016-01-30 NOTE — Telephone Encounter (Signed)
Patient Name: Cheryl Jones DOB: 11-14-1961 Initial Comment Caller states is in terrible pain right now, back in Sept had similar pain, went to [facility] and they did CT Scan and was told she had a kidney stone. 5th event of this kind of pain. Went to urologist and was told it doesn't sound like a stone. He said maybe a spastic colon - Pain is in lower right around hip bone and has a big knot Nurse Assessment Nurse: Charna Elizabeth, RN, Lynden Ang Date/Time (Eastern Time): 01/30/2016 2:19:41 PM Confirm and document reason for call. If symptomatic, describe symptoms. You must click the next button to save text entered. ---Caller states she developed right lower abdominal pain again this morning (rated as an 8 on the 1 to 10 scale). No fever. No injury in the past 3 days. No vomiting or diarrhea. Alert and responsive. Has the patient traveled out of the country within the last 30 days? ---No Does the patient have any new or worsening symptoms? ---Yes Will a triage be completed? ---Yes Related visit to physician within the last 2 weeks? ---No Does the PT have any chronic conditions? (i.e. diabetes, asthma, etc.) ---Yes List chronic conditions. ---High Blood Pressure, Kidney Stone Is the patient pregnant or possibly pregnant? (Ask all females between the ages of 66-55) ---No Is this a behavioral health or substance abuse call? ---No Guidelines Guideline Title Affirmed Question Affirmed Notes Abdominal Pain - Female [1] SEVERE pain (e.g., excruciating) AND [2] present > 1 hour Final Disposition User Go to ED Now Charna Elizabeth, RN, [facility] Referrals [facility] - ED Disagree/Comply: Comply

## 2016-01-30 NOTE — ED Provider Notes (Signed)
CSN: [id number]     Arrival date & time 01/30/16  1529 History   First MD Initiated Contact with Patient 01/30/16 1544     Chief Complaint  Patient presents with  . Flank Pain     HPI Patient presents the emergency department with severe right-sided abdominal pain over the past 4 hours.  On arrival to emergency department she states her pain is completely resolved.  She denies dysuria or urinary frequency.  She denies back pain.  She denies right upper quadrant pain.  She does have a history of hiatal hernia.  She does have a history of a right renal stone but she's never been diagnosed with a right sided ureteral stone.  This is now her sixth episode of similar type pain over the past 6 months.  She had a colonoscopy 2 years ago which demonstrated diverticulosis and no other obvious abnormalities.  She has seen urology who does not believe her symptoms are coming from a ureteral stone.  Her right renal stone is been stable.  She does have a history of right inguinal hernia repair but reports no groin pain at this time.  She still has her ovaries.  She never has nausea vomiting with the pain.  In the past she's tried ketorolac with some improvement.  She states when she has this discomfort or pain she feels a knot in the right side of her abdomen.    Past Medical History  Diagnosis Date  . DM (diabetes mellitus), gestational   . Plantar fasciitis   . Hypertension   . Kidney stone    Past Surgical History  Procedure Laterality Date  . Inguinal hernia repair  2002    right  . Dilation and curettage of uterus    . Hernia repair     Family History  Problem Relation Age of Onset  . Coronary artery disease Neg Hx   . Esophageal cancer Neg Hx   . Rectal cancer Neg Hx   . Stomach cancer Neg Hx   . Hypertension Other   . Colon cancer Paternal Uncle 48  . Hyperlipidemia Mother   . Hypertension Mother   . Hyperlipidemia Father   . Hypertension Father    Social History  Substance Use  Topics  . Smoking status: Never Smoker   . Smokeless tobacco: Never Used  . Alcohol Use: 1.8 oz/week    3 Glasses of wine per week   OB History    No data available     Review of Systems  All other systems reviewed and are negative.     Allergies  Review of patient's allergies indicates no known allergies.  Home Medications   Prior to Admission medications   Medication Sig Start Date End Date Taking? Authorizing Provider  chlorpheniramine-HYDROcodone (TUSSIONEX PENNKINETIC ER) 10-8 MG/5ML SUER Take 5 mLs by mouth every 12 (twelve) hours as needed for cough. 10/24/15   Aleksei Plotnikov V, MD  Cholecalciferol (VITAMIN D3) 1000 UNITS CAPS Take by mouth.      Historical Provider, MD  fluticasone (FLONASE) 50 MCG/ACT nasal spray Place 1 spray into the nose daily. 06/07/11 06/06/12  Aleksei Plotnikov V, MD  Fluticasone Furoate-Vilanterol (BREO ELLIPTA) 100-25 MCG/INH AEPB Inhale 1 Act into the lungs daily. 10/24/15   Aleksei Plotnikov V, MD  ketorolac (TORADOL) 10 MG tablet Take 1 tablet (10 mg total) by mouth every 6 (six) hours as needed. 08/23/15   Sherren Mocha, MD  loratadine (CLARITIN) 10 MG tablet Take 10 mg  by mouth daily.      Historical Provider, MD  meloxicam (MOBIC) 15 MG tablet TAKE 1 TABLET (15 MG TOTAL) BY MOUTH DAILY AS NEEDED FOR PAIN. 12/02/15   Tresa Garter, MD  Nutritional Supplements (JUICE PLUS FIBRE PO) Take 2 tablets by mouth daily.    Historical Provider, MD  oxyCODONE-acetaminophen (PERCOCET/ROXICET) 5-325 MG tablet Take 1 tablet by mouth every 4 (four) hours as needed for severe pain. 01/30/16   Azalia Bilis, MD  phenazopyridine (PYRIDIUM) 200 MG tablet Take 1 tablet (200 mg total) by mouth 3 (three) times daily as needed for pain. Patient not taking: Reported on 10/24/2015 05/10/15   Wallis Bamberg, PA-C  Probiotic Product (PROBIOTIC DAILY PO) Take by mouth daily.    Historical Provider, MD  valsartan-hydrochlorothiazide (DIOVAN-HCT) 320-25 MG per tablet Take 1  tablet by mouth daily. 08/08/15   Aleksei Plotnikov V, MD   BP 156/92 mmHg  Pulse 78  Temp(Src) 98 F (36.7 C) (Oral)  Resp 18  Ht  (1.549 m)  Wt 155 lb (70.308 kg)  BMI 29.30 kg/m2  SpO2 99% Physical Exam  Constitutional: She is oriented to person, place, and time. She appears well-developed and well-nourished. No distress.  HENT:  Head: Normocephalic and atraumatic.  Eyes: EOM are normal.  Neck: Normal range of motion.  Cardiovascular: Normal rate, regular rhythm and normal heart sounds.   Pulmonary/Chest: Effort normal and breath sounds normal.  Abdominal: Soft. She exhibits no distension. There is no tenderness.  Musculoskeletal: Normal range of motion.  Neurological: She is alert and oriented to person, place, and time.  Skin: Skin is warm and dry.  Psychiatric: She has a normal mood and affect. Judgment normal.  Nursing note and vitals reviewed.   ED Course  Procedures (including critical care time) Labs Review Labs Reviewed  URINALYSIS, ROUTINE W REFLEX MICROSCOPIC (NOT AT [facility]) - Abnormal; Notable for the following:    Ketones, ur 15 (*)    All other components within normal limits  CBC WITH DIFFERENTIAL/PLATELET - Abnormal; Notable for the following:    WBC 10.7 (*)    Neutro Abs 8.8 (*)    All other components within normal limits  COMPREHENSIVE METABOLIC PANEL - Abnormal; Notable for the following:    Glucose, Bld 107 (*)    Total Protein 6.3 (*)    ALT 13 (*)    All other components within normal limits  LIPASE, BLOOD    Imaging Review No results found. I have personally reviewed and evaluated these images and lab results as part of my medical decision-making.   EKG Interpretation None      MDM   Final diagnoses:  Right sided abdominal pain    Patient completely without symptoms at this time.  No focal right sided abdominal or right upper quadrant abdominal pain.  No urinary complaints.  Urine, labs, LFTs, lipase are normal.  I do not  believe this is cholelithiasis.  If her symptoms continue to recur she may benefit from a right upper quadrant ultrasound.  She had a CT scan back in September 2016 when she had similar symptoms at that time besides the hiatal hernia and right renal stone everything else looked normal.  This does not sound like intermittent small bowel obstruction.  This does not sound like hernia type pain.  She'll be referred back to gastroenterology as this could represent IBS.  She'll be discharged home with a prescription for oxycodone in case she develops recurrent pain.  I'm  asked that she return to the emergency department for new or worsening symptoms.  Well-appearing at this time.    Azalia Bilis, MD 01/30/16 [phone]

## 2016-01-30 NOTE — Discharge Instructions (Signed)

## 2016-01-30 NOTE — ED Notes (Signed)
Right groin pain that started at 11am, pt reports a 'knot' on her side.  Saw urology on Monday.

## 2016-02-02 ENCOUNTER — Telehealth: Payer: Self-pay | Admitting: Internal Medicine

## 2016-02-02 DIAGNOSIS — R1084 Generalized abdominal pain: Secondary | ICD-10-CM

## 2016-02-02 NOTE — Telephone Encounter (Signed)
Will do Thx 

## 2016-02-02 NOTE — Telephone Encounter (Signed)
Pt was in the ED with abdominal pains which they thought it may be due to a kidney stone. The urologist doesn't think that it is and the ED doctor wanted her to get a hold of you and maybe send her to a gastro doctor. She's hoping you can just refer her to someone without coming in.  Her best number is [phone]

## 2016-02-04 ENCOUNTER — Telehealth: Payer: Self-pay | Admitting: Gastroenterology

## 2016-02-04 NOTE — Telephone Encounter (Signed)
Pt informed

## 2016-02-04 NOTE — Telephone Encounter (Signed)
OK 

## 2016-02-06 NOTE — Telephone Encounter (Signed)
LM on Vmail to CB to sch'd appt

## 2016-02-16 ENCOUNTER — Encounter: Payer: Self-pay | Admitting: Physician Assistant

## 2016-02-16 ENCOUNTER — Ambulatory Visit (INDEPENDENT_AMBULATORY_CARE_PROVIDER_SITE_OTHER): Payer: BLUE CROSS/BLUE SHIELD | Admitting: Physician Assistant

## 2016-02-16 VITALS — BP 128/90 | HR 78 | Ht 61.0 in | Wt 155.0 lb

## 2016-02-16 DIAGNOSIS — R1031 Right lower quadrant pain: Secondary | ICD-10-CM | POA: Diagnosis not present

## 2016-02-16 MED ORDER — HYOSCYAMINE SULFATE 0.125 MG SL SUBL: 0.1250 mg | SUBLINGUAL_TABLET | SUBLINGUAL | Status: DC | PRN

## 2016-02-16 NOTE — Progress Notes (Signed)
Patient ID: Cheryl Jones, female   DOB: Apr 19, 1961, 55 y.o.   MRN: [id number]   Subjective:    Patient ID: Cheryl Jones, female    DOB: Apr 19, 1961, 55 y.o.   MRN: [id number]  HPI Cheryl Jones is a pleasant 55 year old white female known previously to Dr. Arlyce DiceKaplan from colonoscopy done in 2014. This was done for screening and showed moderate sigmoid diverticulosis and otherwise negative exam. She comes in today after recent ER visit for right lower quadrant pain. Patient has had a prior left inguinal hernia repair done laparoscopically no other abdominal surgery. She says the initial episode of acute right lower quadrant pain she had was in September 2016. She had ER visit at that time and had CT scan done which showed a large hiatal hernia, no gallstones she had a nonobstructive calculus in the right kidney and otherwise negative exam. He says since then she has had 5 discrete episodes of pain which have been intense. He says in between these episodes she feels fine and the pain comes on out of the blue and may last anywhere from 2-4 hours and then resolves. She says it's very intense like later labor pains located in the right mid and right lower quadrant and she feels a knot-like sensation in that area when the pain is present. Is not been associated with nausea or vomiting though she feels queasy from the pain. No fever or chills. No changes in her bowel habits with these episodes are after episodes. Other episode in February, had another ER visit but did not have imaging done. Labs were done and unrevealing except wBC mildly elevated in the 10 range. She was asked to see her urologist/Dr. Isabel CapriceGrapey. She says she has been seen there, plain films were done. She was told that her pain was atypical for kidney stone pain but that she should probably proceed with lithotripsy anyway. She was also asked to have GI evaluation. Her most recent episode was 2 days ago though she says this was very mild and she was able to  "breathe" her way through it.  Review of Systems Pertinent positive and negative review of systems were noted in the above HPI section.  All other review of systems was otherwise negative.  Outpatient Encounter Prescriptions as of 02/16/2016  Medication Sig  . loratadine (CLARITIN) 10 MG tablet Take 10 mg by mouth daily.    . meloxicam (MOBIC) 15 MG tablet TAKE 1 TABLET (15 MG TOTAL) BY MOUTH DAILY AS NEEDED FOR PAIN.  Marland Kitchen. Nutritional Supplements (JUICE PLUS FIBRE PO) Take 2 tablets by mouth daily.  Marland Kitchen. oxyCODONE-acetaminophen (PERCOCET/ROXICET) 5-325 MG tablet Take 1 tablet by mouth every 4 (four) hours as needed for severe pain.  . Probiotic Product (PROBIOTIC DAILY PO) Take by mouth daily.  . valsartan-hydrochlorothiazide (DIOVAN-HCT) 320-25 MG per tablet Take 1 tablet by mouth daily.  . hyoscyamine (LEVSIN/SL) 0.125 MG SL tablet Place 1 tablet (0.125 mg total) under the tongue every 4 (four) hours as needed.  . [DISCONTINUED] chlorpheniramine-HYDROcodone (TUSSIONEX PENNKINETIC ER) 10-8 MG/5ML SUER Take 5 mLs by mouth every 12 (twelve) hours as needed for cough.  . [DISCONTINUED] Cholecalciferol (VITAMIN D3) 1000 UNITS CAPS Take by mouth.    . [DISCONTINUED] fluticasone (FLONASE) 50 MCG/ACT nasal spray Place 1 spray into the nose daily.  . [DISCONTINUED] Fluticasone Furoate-Vilanterol (BREO ELLIPTA) 100-25 MCG/INH AEPB Inhale 1 Act into the lungs daily.  . [DISCONTINUED] ketorolac (TORADOL) 10 MG tablet Take 1 tablet (10 mg total) by mouth every 6 (six)  hours as needed.  . [DISCONTINUED] phenazopyridine (PYRIDIUM) 200 MG tablet Take 1 tablet (200 mg total) by mouth 3 (three) times daily as needed for pain. (Patient not taking: Reported on 10/24/2015)   No facility-administered encounter medications on file as of 02/16/2016.   No Known Allergies Patient Active Problem List   Diagnosis Date Noted  . Asthmatic bronchitis 10/24/2015  . H/O gestational diabetes mellitus, not currently pregnant  08/08/2015  . Proteinuria 05/10/2015  . Urinary tract infectious disease 05/10/2015  . Well adult exam 04/20/2013  . Dizziness 01/11/2012  . Hyperglycemia 01/11/2012  . Hypertension 06/07/2011  . Seromucinous otitis media 06/07/2011  . HIP PAIN 02/23/2011  . ELEVATED BLOOD PRESSURE 02/23/2011  . FASCIITIS, PLANTAR 03/21/2009  . CARPAL TUNNEL SYNDROME 09/11/2007   Social History   Social History  . Marital Status: Married    Spouse Name: N/A  . Number of Children: N/A  . Years of Education: N/A   Occupational History  . housewife    Social History Main Topics  . Smoking status: Never Smoker   . Smokeless tobacco: Never Used  . Alcohol Use: 1.8 oz/week    3 Glasses of wine per week  . Drug Use: No  . Sexual Activity: Yes   Other Topics Concern  . Not on file   Social History Narrative   Married, 2 sons   Regular exercise-yoga    Ms. Quintero's family history includes Colon cancer (age of onset: 110) in her paternal uncle; Hyperlipidemia in her father and mother; Hypertension in her father, mother, and other. There is no history of Coronary artery disease, Esophageal cancer, Rectal cancer, or Stomach cancer.      Objective:    Filed Vitals:   02/16/16 0936  BP: 128/90  Pulse: 78    Physical Exam  well-developed white female in no acute distress, pleasant blood pressure 128/90 pulse 78 height 5 foot 1 weight 155. HEENT; nontraumatic, cephalic EOMI PERRLA sclera anicteric, Cardiovascular; regular rate and rhythm with S1-S2 no murmur or gallop, Pulmonary; clear bilaterally, Abdomen ;soft minimally tender in the right lower quadrant there is no guarding or rebound no palpable mass or hepatosplenomegaly, Rectal; exam not done, Extremities; no clubbing cyanosis or edema skin warm and dry, Neuropsych; mood ;and affect appropriate     Assessment & Plan:   #1 55 yo female with 5 or 6 episodes of acute right lower quadrant abdominal pain lasting 2-4 hours and intense. All  of these episodes have been sent September 2016. CT scan done with the initial episode with no definite findings though she did have a nonobstructive calculus in the right kidney. Her symptoms are atypical for renal colic though cannot absolutely rule out. Rule out intermittent partial small bowel obstruction, volvulus etc. #2 status post laparoscopic LIH repair #3 sigmoid diverticulosis #4  large hiatal hernia noted on CT  Plan; we will try Levsin sublingual when necessary for episodes of pain Schedule for CT of the abdomen and pelvis Patient is asked to call if she has another acute episode and would like to get imaging at that time at least with plain abdominal films. Patient will be established with Dr. Christella Hartigan.   Amy S Esterwood PA-C 02/16/2016   Cc: Plotnikov, Georgina Quint, MD

## 2016-02-16 NOTE — Progress Notes (Signed)
i agree with the above note, plan 

## 2016-02-16 NOTE — Patient Instructions (Addendum)
We sent a prescription for Levsin SL to take. Dissolve a tablet on the tongue every 4-6 hours as needed for pain.  Call us for episodes of severe pain and ask for Amy's nurse.    You have been scheduled for a CT scan of the abdomen and pelvis at [facility] ([address]---this is in the same building as Press photographer).   You are scheduled on 02-18-2016 at 3:00 PM . You should arrive at 3:15  prior to your appointment time for registration. Please follow the written instructions below on the day of your exam:  WARNING: IF YOU ARE ALLERGIC TO IODINE/X-RAY DYE, PLEASE NOTIFY RADIOLOGY IMMEDIATELY AT [phone]! YOU WILL BE GIVEN A 13 HOUR PREMEDICATION PREP.  1) Do not eat or drink anything after 11:00 am (4 hours prior to your test) 2) You have been given 2 bottles of oral contrast to drink. The solution may taste better if refrigerated, but do NOT add ice or any other liquid to this solution. Shake well before drinking.    Drink 1 bottle of contrast @ 1:00 pm(2 hours prior to your exam)  Drink 1 bottle of contrast @ 2:00 pm (1 hour prior to your exam)  You may take any medications as prescribed with a small amount of water except for the following: Metformin, Glucophage, Glucovance, Avandamet, Riomet, Fortamet, Actoplus Met, Janumet, Glumetza or Metaglip. The above medications must be held the day of the exam AND 48 hours after the exam.  The purpose of you drinking the oral contrast is to aid in the visualization of your intestinal tract. The contrast solution may cause some diarrhea. Before your exam is started, you will be given a small amount of fluid to drink. Depending on your individual set of symptoms, you may also receive an intravenous injection of x-ray contrast/dye. Plan on being at [facility] for 30 minutes or long, depending on the type of exam you are having performed.  If you have any questions regarding your exam or if you need to reschedule, you may  call the CT department at [phone] between the hours of 8:00 am and 5:00 pm, Monday-Friday.  ________________________________________________________________________

## 2016-02-18 ENCOUNTER — Ambulatory Visit (INDEPENDENT_AMBULATORY_CARE_PROVIDER_SITE_OTHER)
Admission: RE | Admit: 2016-02-18 | Discharge: 2016-02-18 | Disposition: A | Discharge status: Home / Self Care | Payer: BLUE CROSS/BLUE SHIELD | Source: Ambulatory Visit | Attending: Physician Assistant | Admitting: Physician Assistant

## 2016-02-18 DIAGNOSIS — R1031 Right lower quadrant pain: Secondary | ICD-10-CM | POA: Diagnosis not present

## 2016-02-18 MED ORDER — IOHEXOL 300 MG/ML  SOLN
100.0000 mL | Freq: Once | INTRAMUSCULAR | Status: AC | PRN
  Administered 2016-02-18: 100 mL via INTRAVENOUS

## 2016-02-27 ENCOUNTER — Telehealth: Payer: Self-pay | Admitting: Internal Medicine

## 2016-02-27 ENCOUNTER — Telehealth: Payer: Self-pay | Admitting: Physician Assistant

## 2016-02-27 NOTE — Telephone Encounter (Signed)
Agree ED evaluation is needed

## 2016-02-27 NOTE — Telephone Encounter (Signed)
Patient Name: Cheryl FudgeKAREN Greenhaw  DOB: [birth year]    Initial Comment Caller states [facility]- has 6 episodes of extreme pain in lower right abd since September-- was told to call while the pain is happening to call to be seen Currently rated about an 8   Nurse Assessment  Nurse: Sherilyn CooterHenry, RN, Thurmond ButtsWade Date/Time (Eastern Time): 02/27/2016 1:09:00 PM  Confirm and document reason for call. If symptomatic, describe symptoms. You must click the next button to save text entered. ---Caller states that she has lower right sided abdominal pain which she rates as 8 on 0-10 scale. She has taken an anti-spasmodic which was prescribed but it has not helped. She was to call the gastro office when she was having the pain so she could be seen during an episode. They are out to lunch. She called her PCP's office.  Has the patient traveled out of the country within the last 30 days? ---No  Does the patient have any new or worsening symptoms? ---Yes  Will a triage be completed? ---Yes  Related visit to physician within the last 2 weeks? ---No  Does the PT have any chronic conditions? (i.e. diabetes, asthma, etc.) ---Yes  List chronic conditions. ---HTN  Is the patient pregnant or possibly pregnant? (Ask all females between the ages of 6912-55) ---No  Is this a behavioral health or substance abuse call? ---No     Guidelines    Guideline Title Affirmed Question Affirmed Notes  Abdominal Pain - Female [1] SEVERE pain (e.g., excruciating) AND [2] present > 1 hour    Final Disposition User   Go to ED Now Sherilyn CooterHenry, RN, Wade    Referrals  [facility] - ED   Disagree/Comply: Comply

## 2016-02-27 NOTE — Telephone Encounter (Signed)
The pt states she is in severe abd pain 10/10 at the worst.  She saw Amy on 02/16/16 and had a CT which showed:   IMPRESSION: 1. Large hiatal hernia contains most of the stomach as well as portions of the pancreas, colon and splenic vein. 2. No additional findings to explain the patient's recurrent right lower quadrant pain. 3. Right renal stone.  Pt was advised to go to the ED for evaluation, no appointments available in the office.  Pt agreed

## 2016-03-01 ENCOUNTER — Telehealth: Payer: Self-pay | Admitting: Physician Assistant

## 2016-03-01 NOTE — Telephone Encounter (Signed)
Please advise on what to do for this patient. She has abdominal pain that comes in spells, it is intense and unpredictable. She had the understanding that she should call and be seen when the symptom was occurring. She has tried going to the ER during an episode of pain, but she remains undiagnosed and comes out with a prescription for pain medication. She had pain on 02/27/16, took the Levsin SL and about 90 minutes later the pain began to subside. She had walked for exercise that morning, but the pain did not occur with walking.  She doesn't know who is her primary GI doctor is. Her chart reflects Russella DarStark agreed to take her. Later when she was seen, Dr Christella HartiganJacobs signed off on the note.

## 2016-03-01 NOTE — Telephone Encounter (Signed)
I believe Russella DarStark did  Agree to take her.. I probably sent my note to jacobs as he was supervising that day.Russella Dar. Stark can be her GI MD... I had advised she be referred to surgery after her CT scan  .Marland Kitchen. Apparently she wanted to think about it- my recommendation is for surgical referral for  Huge Hiatal hernia  containing bowel , pancreas etc-  Very likely source for her pain- if she needs to talk first I can see her WED or Friday afternoon-- go ahead and work on surgery appt - Rosenbower or  Daphine DeutscherMartin would be good.

## 2016-03-01 NOTE — Telephone Encounter (Signed)
Patient is in agreement. CT report faxed to [facility] for review by Dr Daphine DeutscherMartin or Abbey Chattersosenbower. The schedulers are instructed to only schedule patient with manometry, UGI and EGD. Patient has an appointment with her primary GI Dr Russella DarStark on 03/10/16.

## 2016-03-10 ENCOUNTER — Encounter: Payer: Self-pay | Admitting: Gastroenterology

## 2016-03-10 ENCOUNTER — Ambulatory Visit (INDEPENDENT_AMBULATORY_CARE_PROVIDER_SITE_OTHER): Payer: BLUE CROSS/BLUE SHIELD | Admitting: Gastroenterology

## 2016-03-10 VITALS — BP 120/74 | HR 80 | Ht 61.0 in | Wt 154.2 lb

## 2016-03-10 DIAGNOSIS — R1031 Right lower quadrant pain: Secondary | ICD-10-CM

## 2016-03-10 DIAGNOSIS — R935 Abnormal findings on diagnostic imaging of other abdominal regions, including retroperitoneum: Secondary | ICD-10-CM | POA: Diagnosis not present

## 2016-03-10 NOTE — Patient Instructions (Addendum)
You have been scheduled for an [facility]. Please follow written instructions given to you at your visit today. If you use inhalers (even only as needed), please bring them with you on the day of your procedure.   You have been scheduled for an Upper GI Series at _______________________. Your appointment is on __________________ at ________________. Please arrive 15 minutes prior to your test for registration. Make sure not to eat or drink anything after midnight on the night before your test. If you need to reschedule, please call radiology at [phone]. ________________________________________________________________ An upper GI series uses x rays to help diagnose problems of the upper GI tract, which includes the esophagus, stomach, and duodenum. The duodenum is the first part of the small intestine. An upper GI series is conducted by a radiology technologist or a radiologist-a doctor who specializes in x-ray imaging-at a hospital or outpatient center. While sitting or standing in front of an x-ray machine, the patient drinks barium liquid, which is often white and has a chalky consistency and taste. The barium liquid coats the lining of the upper GI tract and makes signs of disease show up more clearly on x rays. X-ray video, called fluoroscopy, is used to view the barium liquid moving through the esophagus, stomach, and duodenum. Additional x rays and fluoroscopy are performed while the patient lies on an x-ray table. To fully coat the upper GI tract with barium liquid, the technologist or radiologist may press on the abdomen or ask the patient to change position. Patients hold still in various positions, allowing the technologist or radiologist to take x rays of the upper GI tract at different angles. If a technologist conducts the upper GI series, a radiologist will later examine the images to look for problems.  This test typically takes about 1 hour to  complete. __________________________________________________________________  Cheryl QuinYou have been scheduled for an esophageal man[facility] 03[facility]/16 at 10:30am. Please arrive 30 minutes prior to your procedure for registration. You will need to go to outpatient registration (1st floor of the hospital) first. Make certain to bring your insurance cards as well as a complete list of medications.  Please remember the following:  1) Nothing to eat or drink after 12:00 midnight on the night before your test.  2) Hold all diabetic medications/insulin the morning of the test. You may eat and take             your medications after the test.  3) For 3 days prior to your test do not take: Dexilant, Prevacid, Nexium, Protonix,         Aciphex, Zegerid, Pantoprazole, Prilosec or omeprazole.  4) For 2 days prior to your test, do not take: Reglan, Tagamet, Zantac, Axid or Pepcid.  5) You MAY use an antacid such as Rolaids or Tums up to 12 hours prior to your test.  It will take at least 2 weeks to receive the results of this test from your physician. ------------------------------------------ ABOUT ESOPHAGEAL MANOMETRY Esophageal manometry (muh-NOM-uh-tree) is a test that gauges how well your esophagus works. Your esophagus is the long, muscular tube that connects your throat to your stomach. Esophageal manometry measures the rhythmic muscle contractions (peristalsis) that occur in your esophagus when you swallow. Esophageal manometry also measures the coordination and force exerted by the muscles of your esophagus.  During esophageal manometry, a thin, flexible tube (catheter) that contains sensors is passed through your nose, down your esophagus and into your stomach. Esophageal manometry can be helpful  in diagnosing some mostly uncommon disorders that affect your esophagus.  Why it's done Esophageal manometry is used to evaluate the movement (motility) of food through the esophagus and  into the stomach. The test measures how well the circular bands of muscle (sphincters) at the top and bottom of your esophagus open and close, as well as the pressure, strength and pattern of the wave of esophageal muscle contractions that moves food along.  What you can expect Esophageal manometry is an outpatient procedure done without sedation. Most people tolerate it well. You may be asked to change into a hospital gown before the test starts.  During esophageal manometry  While you are sitting up, a member of your health care team sprays your throat with a numbing medication or puts numbing gel in your nose or both.  A catheter is guided through your nose into your esophagus. The catheter may be sheathed in a water-filled sleeve. It doesn't interfere with your breathing. However, your eyes may water, and you may gag. You may have a slight nosebleed from irritation.  After the catheter is in place, you may be asked to lie on your back on an exam table, or you may be asked to remain seated.  You then swallow small sips of water. As you do, a computer connected to the catheter records the pressure, strength and pattern of your esophageal muscle contractions.  During the test, you'll be asked to breathe slowly and smoothly, remain as still as possible, and swallow only when you're asked to do so.  A member of your health care team may move the catheter down into your stomach while the catheter continues its measurements.  The catheter then is slowly withdrawn. The test usually lasts 20 to 30 minutes.  After esophageal manometry  When your esophageal manometry is complete, you may return to your normal activities  This test typically takes 30-45 minutes to complete. ________________________________________________________________________________  Thank you for choosing me and [facility].  Venita Lick. Pleas Koch., MD., Clementeen Graham

## 2016-03-10 NOTE — Progress Notes (Signed)
    History of Present Illness: This is a 55 year old female who presented with recurrent episodes of right lower quadrant pain lasting several hours at a time. Totally asymptomatic in between episodes. No GERD symptoms. CT scan imaging results below. Patient returns today to review CT findings and establish plans. Colonoscopy by Dr. Arlyce DiceKaplan in September 2014 showed moderate sigmoid diverticulosis and was otherwise normal. Dr Billy Coastaavon has pelvic US planned next week. Dr. Isabel CapriceGrapey has evaluated her and does not feel her RLQ pain is related to her right renal stone.   Abd/pelvic CT IMPRESSION 02/18/16: 1. Large hiatal hernia contains most of the stomach as well as portions of the pancreas, colon and splenic vein. 2. No additional findings to explain the patient's recurrent right lower quadrant pain. 3. Right renal stone.  Current Medications, Allergies, Past Medical History, Past Surgical History, Family History and Social History were reviewed in [facility] electronic medical record.  Physical Exam: General: Well developed, well nourished, no acute distress Head: Normocephalic and atraumatic Eyes:  sclerae anicteric, EOMI Ears: Normal auditory acuity Mouth: No deformity or lesions Lungs: Clear throughout to auscultation Heart: Regular rate and rhythm; no murmurs, rubs or bruits Abdomen: Soft, non tender and non distended. No masses, hepatosplenomegaly or hernias noted. Normal Bowel sounds Musculoskeletal: Symmetrical with no gross deformities  Pulses:  Normal pulses noted Extremities: No clubbing, cyanosis, edema or deformities noted Neurological: Alert oriented x 4, grossly nonfocal Psychological:  Alert and cooperative. Normal mood and affect  Assessment and Recommendations:  1. Episodic RLQ and a large hiatal hernia containing the majority of the stomach and portions of the pancreas colon and splenic vein. A small right inguinal hernia containing fat also noted. Referral to Dr.  Abbey Chattersosenbower. Preoperative evaluation requested by general surgery to include a UGI series, EGD and esophageal manometry. Colonoscopy performed in September 2014.   I spent 25 minutes of face-to-face time with the patient. Greater than 50% of the time was spent counseling and coordinating care.

## 2016-03-11 ENCOUNTER — Encounter: Payer: Self-pay | Admitting: Gastroenterology

## 2016-03-11 ENCOUNTER — Ambulatory Visit (AMBULATORY_SURGERY_CENTER): Payer: BLUE CROSS/BLUE SHIELD | Admitting: Gastroenterology

## 2016-03-11 VITALS — BP 127/82 | HR 72 | Temp 98.7°F | Resp 16 | Ht 61.0 in | Wt 154.0 lb

## 2016-03-11 DIAGNOSIS — R933 Abnormal findings on diagnostic imaging of other parts of digestive tract: Secondary | ICD-10-CM

## 2016-03-11 DIAGNOSIS — K3189 Other diseases of stomach and duodenum: Secondary | ICD-10-CM | POA: Diagnosis not present

## 2016-03-11 DIAGNOSIS — K219 Gastro-esophageal reflux disease without esophagitis: Secondary | ICD-10-CM | POA: Diagnosis not present

## 2016-03-11 DIAGNOSIS — K2100 Gastro-esophageal reflux disease with esophagitis, without bleeding: Secondary | ICD-10-CM

## 2016-03-11 DIAGNOSIS — R1031 Right lower quadrant pain: Secondary | ICD-10-CM

## 2016-03-11 DIAGNOSIS — R935 Abnormal findings on diagnostic imaging of other abdominal regions, including retroperitoneum: Secondary | ICD-10-CM

## 2016-03-11 MED ORDER — SODIUM CHLORIDE 0.9 % IV SOLN: 500.0000 mL | INTRAVENOUS | Status: DC

## 2016-03-11 MED ORDER — OMEPRAZOLE 40 MG PO CPDR: 40.0000 mg | DELAYED_RELEASE_CAPSULE | Freq: Every day | ORAL | Status: DC

## 2016-03-11 NOTE — Op Note (Signed)
[facility] Patient Name: Cheryl Jones Procedure Date: 03/11/2016 1:16 PM MRN: [id number] Endoscopist: Meryl Dare , MD Age: 55 Referring MD:  Date of Birth: 06-19-1961 Gender: Female Procedure:                Upper GI endoscopy Indications:              Abdominal pain in the right lower quadrant,                            Gastro-esophageal reflux disease, Abnormal CT of                            the GI tract Medicines:                Monitored Anesthesia Care Procedure:                Pre-Anesthesia Assessment:                           - Prior to the procedure, a History and Physical                            was performed, and patient medications and                            allergies were reviewed. The patient's tolerance of                            previous anesthesia was also reviewed. The risks                            and benefits of the procedure and the sedation                            options and risks were discussed with the patient.                            All questions were answered, and informed consent                            was obtained. Prior Anticoagulants: The patient has                            taken no previous anticoagulant or antiplatelet                            agents. ASA Grade Assessment: II - A patient with                            mild systemic disease. After reviewing the risks                            and benefits, the patient was deemed in  satisfactory condition to undergo the procedure.                           After obtaining informed consent, the endoscope was                            passed under direct vision. Throughout the                            procedure, the patient's blood pressure, pulse, and                            oxygen saturations were monitored continuously. The                            Model GIF-[id number]) scope was introduced          through the mouth, and advanced to the second part                            of duodenum. The upper GI endoscopy was                            accomplished without difficulty. The patient                            tolerated the procedure well. Scope In: Scope Out: Findings:      LA Grade C (one or more mucosal breaks continuous between tops of 2 or       more mucosal folds, less than 75% circumference) esophagitis with no       bleeding was found in the distal esophagus.      Circumferential salmon-colored mucosa was present from 24 to 27 cm. The       maximum longitudinal extent of these esophageal mucosal changes was 3 cm       in length. Biopsies were taken with a cold forceps for histology.      The exam of the esophagus was otherwise normal.      A few localized, 3 mm non-bleeding erosions were found in the gastric       antrum. There were no stigmata of recent bleeding. Biopsies were taken       with a cold forceps for histology. Large hiatal hernia with the diaphram       appeared to be at the distal gastric body near the anrtal erosions       (likely Cameron erosions).      The exam of the stomach was otherwise normal.      Localized mildly erythematous mucosa without active bleeding and with no       stigmata of bleeding was found in the duodenal bulb.      The exam of the duodenum was otherwise normal. Complications:            No immediate complications. Estimated Blood Loss:     Estimated blood loss was minimal. Impression:               - LA Grade C reflux esophagitis.                           -  Salmon-colored mucosa suspicious for Barrett's                            esophagus. Biopsied.                           - Large hiatal hernia containing the majority of                            the stomach                           - Non-bleeding erosive gastropathy. Biopsied.                           - Erythematous duodenopathy. Recommendation:           - Patient  has a contact number available for                            emergencies. The signs and symptoms of potential                            delayed complications were discussed with the                            patient. Return to normal activities tomorrow.                            Written discharge instructions were provided to the                            patient.                           - Resume previous diet and follow antireflux                            measures long term.                           - Continue present medications.                           - Await pathology results. EGD in 3 years if                            Barrett's without dysplasia is found.                           - Prilosec (omeprazole) 40 mg PO daily indefinitely. Procedure Code(s):        --- Professional ---                           [phone], Esophagogastroduodenoscopy, flexible,  transoral; with biopsy, single or multiple CPT copyright 2016 American Medical Association. All rights reserved. Meryl Dare, MD 03/11/2016 1:49:33 PM This report has been signed electronically. Number of Addenda: 0 Referring MD:      Sonda Primes, MD

## 2016-03-11 NOTE — Patient Instructions (Addendum)
YOU HAD AN ENDOSCOPIC PROCEDURE TODAY AT THE [facility]:   Refer to the procedure report that was given to you for any specific questions about what was found during the examination.  If the procedure report does not answer your questions, please call your gastroenterologist to clarify.  If you requested that your care partner not be given the details of your procedure findings, then the procedure report has been included in a sealed envelope for you to review at your convenience later.  YOU SHOULD EXPECT: Some feelings of bloating in the abdomen. Passage of more gas than usual.  Walking can help get rid of the air that was put into your GI tract during the procedure and reduce the bloating. If you had a lower endoscopy (such as a colonoscopy or flexible sigmoidoscopy) you may notice spotting of blood in your stool or on the toilet paper. If you underwent a bowel prep for your procedure, you may not have a normal bowel movement for a few days.  Please Note:  You might notice some irritation and congestion in your nose or some drainage.  This is from the oxygen used during your procedure.  There is no need for concern and it should clear up in a day or so.  SYMPTOMS TO REPORT IMMEDIATELY:   Following lower endoscopy (colonoscopy or flexible sigmoidoscopy):  Excessive amounts of blood in the stool  Significant tenderness or worsening of abdominal pains  Swelling of the abdomen that is new, acute  Fever of 100F or higher   For urgent or emergent issues, a gastroenterologist can be reached at any hour by calling [phone].   DIET: Your first meal following the procedure should be a small meal and then it is ok to progress to your normal diet. Heavy or fried foods are harder to digest and may make you feel nauseous or bloated.  Likewise, meals heavy in dairy and vegetables can increase bloating.  Drink plenty of fluids but you should avoid alcoholic beverages for 24  hours.  ACTIVITY:  You should plan to take it easy for the rest of today and you should NOT DRIVE or use heavy machinery until tomorrow (because of the sedation medicines used during the test).    FOLLOW UP: Our staff will call the number listed on your records the next business day following your procedure to check on you and address any questions or concerns that you may have regarding the information given to you following your procedure. If we do not reach you, we will leave a message.  However, if you are feeling well and you are not experiencing any problems, there is no need to return our call.  We will assume that you have returned to your regular daily activities without incident.  If any biopsies were taken you will be contacted by phone or by letter within the next 1-3 weeks.  Please call us at [phone] if you have not heard about the biopsies in 3 weeks.    SIGNATURES/CONFIDENTIALITY: You and/or your care partner have signed paperwork which will be entered into your electronic medical record.  These signatures attest to the fact that that the information above on your After Visit Summary has been reviewed and is understood.  Full responsibility of the confidentiality of this discharge information lies with you and/or your care-partner.  Read all of the handouts given to you by your recovery room nurse.  Thank-you for choosing [place] for your medical needs today.  Be sure to take your omeprazole every day without exception.

## 2016-03-11 NOTE — Progress Notes (Signed)
Patient denies any allergies to eggs or soy. 

## 2016-03-11 NOTE — Progress Notes (Signed)
Report to PACU, RN, vss, BBS= Clear.  

## 2016-03-11 NOTE — Progress Notes (Signed)
Called to room to assist during endoscopic procedure.  Patient ID and intended procedure confirmed with present staff. Received instructions for my participation in the procedure from the performing physician.  

## 2016-03-12 ENCOUNTER — Telehealth: Payer: Self-pay | Admitting: *Deleted

## 2016-03-12 NOTE — Telephone Encounter (Signed)
  Follow up Call-  Call back number 03/11/2016 08/15/2013  Post procedure Call Back phone  # [phone] cell [phone]  Permission to leave phone message Yes Yes     Patient questions:  Do you have a fever, pain , or abdominal swelling? No. Pain Score  0 *  Have you tolerated food without any problems? Yes.    Have you been able to return to your normal activities? Yes.    Do you have any questions about your discharge instructions: Diet   No. Medications  No. Follow up visit  Yes.    Do you have questions or concerns about your Care? yes  Actions: * If pain score is 4 or above:Patient states she has note for followup appointment in one month with Dr. Russella DarStark. She was advised  To await phone call from office. Call office if she has not heard from them in the next couple of days.  She voiced understanding. No action needed, pain <4.

## 2016-03-19 ENCOUNTER — Ambulatory Visit (HOSPITAL_COMMUNITY)
Admission: RE | Admit: 2016-03-19 | Discharge: 2016-03-19 | Disposition: A | Discharge status: Home / Self Care | Payer: BLUE CROSS/BLUE SHIELD | Source: Ambulatory Visit | Attending: Gastroenterology | Admitting: Gastroenterology

## 2016-03-19 ENCOUNTER — Encounter: Payer: Self-pay | Admitting: Gastroenterology

## 2016-03-19 DIAGNOSIS — R935 Abnormal findings on diagnostic imaging of other abdominal regions, including retroperitoneum: Secondary | ICD-10-CM | POA: Diagnosis not present

## 2016-03-19 DIAGNOSIS — R1031 Right lower quadrant pain: Secondary | ICD-10-CM | POA: Diagnosis present

## 2016-03-19 DIAGNOSIS — K449 Diaphragmatic hernia without obstruction or gangrene: Secondary | ICD-10-CM | POA: Insufficient documentation

## 2016-03-29 ENCOUNTER — Encounter (HOSPITAL_COMMUNITY)
Admission: RE | Disposition: A | Discharge status: Home / Self Care | Payer: Self-pay | Source: Ambulatory Visit | Attending: Gastroenterology

## 2016-03-29 ENCOUNTER — Ambulatory Visit (HOSPITAL_COMMUNITY)
Admission: RE | Admit: 2016-03-29 | Discharge: 2016-03-29 | Disposition: A | Discharge status: Home / Self Care | Payer: BLUE CROSS/BLUE SHIELD | Source: Ambulatory Visit | Attending: Gastroenterology | Admitting: Gastroenterology

## 2016-03-29 DIAGNOSIS — K2100 Gastro-esophageal reflux disease with esophagitis, without bleeding: Secondary | ICD-10-CM | POA: Insufficient documentation

## 2016-03-29 DIAGNOSIS — K228 Other specified diseases of esophagus: Secondary | ICD-10-CM | POA: Insufficient documentation

## 2016-03-29 DIAGNOSIS — K449 Diaphragmatic hernia without obstruction or gangrene: Secondary | ICD-10-CM | POA: Insufficient documentation

## 2016-03-29 DIAGNOSIS — K219 Gastro-esophageal reflux disease without esophagitis: Secondary | ICD-10-CM | POA: Diagnosis present

## 2016-03-29 HISTORY — PX: ESOPHAGEAL MANOMETRY: SHX5429

## 2016-03-29 SURGERY — MANOMETRY, ESOPHAGUS

## 2016-03-29 MED ORDER — LIDOCAINE VISCOUS 2 % MT SOLN
OROMUCOSAL | Status: AC
  Filled 2016-03-29: qty 15

## 2016-03-29 SURGICAL SUPPLY — 2 items
FACESHIELD LNG OPTICON STERILE (SAFETY) IMPLANT
GLOVE BIO SURGEON STRL SZ8 (GLOVE) ×6 IMPLANT

## 2016-03-30 ENCOUNTER — Encounter (HOSPITAL_COMMUNITY): Payer: Self-pay | Admitting: Gastroenterology

## 2016-03-30 ENCOUNTER — Encounter: Payer: Self-pay | Admitting: General Surgery

## 2016-03-30 NOTE — Progress Notes (Unsigned)
Cheryl Jones 03/30/2016 3:34 PM Location: [facility] Patient #: [id number] DOB: [birth year] Married / Language: [name] / Race: White Female  History of Present Illness Cheryl Pollack MD; 03/30/2016 5:57 PM) Patient words: reck.  The patient is a 55 year old female.   Note:She is referred by Dr. Russella Dar for consultation regarding a large hiatal hernia. She first became aware of the hernia after the birth of her second child 18 years ago. She had fairly significant reflux symptoms at this time. She states that those reflux symptoms eventually resolved. Recently, she's had some severe episodes of right lower quadrant cramping type pain associated with a knot in the right lower quadrant/groin area. These have been severe. She's been to the emergency department for this in February of this year. Once the knot goes away, the pain goes away. She does have a known right renal stone. CT scan performed in March of this year demonstrated a small right inguinal hernia containing fat, a large hiatal hernia containing most of the stomach as well as portions of the pancreas, colon, and splenic vein. There is evidence of her previous left inguinal hernia repair (she had a laparoscopic repair in the past). She reports to me that she has no heartburn, no chest type pressure up her abdominal pain, no shortness of breath with activity.  Upper endoscopy was done that demonstrated some inflammatory changes of the distal esophagus. Biopsies were done here. Pathology did not demonstrate metaplastic or dysplastic changes. She had an upper GI which demonstrated a moderate to large hiatal hernia. On my view, only about one third of the stomach is up in the chest on this study.  She is here with her husband.  I have reviewed her office notes, pathology, EGD, CT scans and UGI.  Other Problems Gilmer Mor, CMA; 03/30/2016 3:34 PM) Gastroesophageal Reflux Disease High blood  pressure Hypercholesterolemia Inguinal Hernia Kidney Stone  Past Surgical History Gilmer Mor, CMA; 03/30/2016 3:34 PM) Laparoscopic Inguinal Hernia Surgery Left.  Diagnostic Studies History Gilmer Mor, CMA; 03/30/2016 3:34 PM) Colonoscopy 1-5 years ago Mammogram within last year Pap Smear 1-5 years ago  Allergies Gilmer Mor, CMA; 03/30/2016 3:35 PM) No Known Drug Allergies 03/30/2016  Medication History (Sonya Bynum, CMA; 03/30/2016 3:37 PM) Meloxicam (15MG  Tablet, Oral) Active. Omeprazole (40MG  Capsule DR, Oral) Active. Valsartan-Hydrochlorothiazide (320-25MG  Tablet, Oral) Active. Omeprazole (10MG  Capsule DR, Oral) Active. Turmeric (450MG  Capsule, Oral) Active. Probiotic + Omega-3 (Oral) Active. Valsartan (40MG  Tablet, Oral) Active. Medications Reconciled  Social History Gilmer Mor, CMA; 03/30/2016 3:34 PM) Alcohol use Occasional alcohol use. Caffeine use Coffee, Tea. No drug use Tobacco use Former smoker.  Family History Gilmer Mor, CMA; 03/30/2016 3:34 PM) Arthritis Father, Mother. Colon Polyps Father. Diabetes Mellitus Sister. Hypertension Father, Mother. Migraine Headache Sister. Thyroid problems Mother.  Pregnancy / Birth History Gilmer Mor, CMA; 03/30/2016 3:34 PM) Age at menarche 12 years. Contraceptive History Oral contraceptives. Gravida 2 Irregular periods Maternal age 13-30 Para 2     Review of Systems Lamar Laundry Bynum CMA; 03/30/2016 3:34 PM) General Not Present- Appetite Loss, Chills, Fatigue, Fever, Night Sweats, Weight Gain and Weight Loss. Skin Not Present- Change in Wart/Mole, Dryness, Hives, Jaundice, New Lesions, Non-Healing Wounds, Rash and Ulcer. HEENT Present- Seasonal Allergies and Wears glasses/contact lenses. Not Present- Earache, Hearing Loss, Hoarseness, Nose Bleed, Oral Ulcers, Ringing in the Ears, Sinus Pain, Sore Throat, Visual Disturbances and Yellow Eyes. Respiratory Not Present- Bloody sputum,  Chronic Cough, Difficulty Breathing, Snoring and Wheezing. Breast Not Present- Breast Mass,  Breast Pain, Nipple Discharge and Skin Changes. Cardiovascular Present- Swelling of Extremities. Not Present- Chest Pain, Difficulty Breathing Lying Down, Leg Cramps, Palpitations, Rapid Heart Rate and Shortness of Breath. Gastrointestinal Present- Excessive gas and Gets full quickly at meals. Not Present- Abdominal Pain, Bloating, Bloody Stool, Change in Bowel Habits, Chronic diarrhea, Constipation, Difficulty Swallowing, Hemorrhoids, Indigestion, Nausea, Rectal Pain and Vomiting. Female Genitourinary Not Present- Frequency, Nocturia, Painful Urination, Pelvic Pain and Urgency. Musculoskeletal Present- Joint Pain and Joint Stiffness. Not Present- Back Pain, Muscle Pain, Muscle Weakness and Swelling of Extremities. Neurological Not Present- Decreased Memory, Fainting, Headaches, Numbness, Seizures, Tingling, Tremor, Trouble walking and Weakness. Psychiatric Not Present- Anxiety, Bipolar, Change in Sleep Pattern, Depression, Fearful and Frequent crying. Endocrine Not Present- Cold Intolerance, Excessive Hunger, Hair Changes, Heat Intolerance, Hot flashes and New Diabetes. Hematology Not Present- Easy Bruising, Excessive bleeding, Gland problems, HIV and Persistent Infections.  Vitals (Sonya Bynum CMA; 03/30/2016 3:35 PM) 03/30/2016 3:35 PM Weight: 152 lb Height: 61in Body Surface Area: 1.68 m Body Mass Index: 28.72 kg/m  Temp.: 63F(Temporal)  Pulse: 76 (Regular)  BP: 128/80 (Sitting, Left Arm, Standard)      Physical Exam Cheryl Pollack MD; 03/30/2016 5:53 PM)  The physical exam findings are as follows: Note:General: Overweight female in NAD. Pleasant and cooperative.  HEENT: [place]/AT, no facial masses  EYES: EOMI, no icterus  CV: RRR, no murmur, no JVD.  CHEST: Breath sounds equal and clear. Respirations nonlabored.  BREASTS: Symmetrical in size. No dominant masses, nipple  discharge or suspicious skin lesions.  ABDOMEN: Soft, nontender, nondistended, no masses, no organomegaly, active bowel sounds, small lower abdominal scars, no hernias.  GU: No bulges.  MUSCULOSKELETAL: FROM, good muscle tone, no edema, no venous stasis changes  SKIN: No jaundice.  NEUROLOGIC: Alert and oriented, answers questions appropriately, normal gait and station.  PSYCHIATRIC: Normal mood, affect , and behavior.    Assessment & Plan Cheryl Pollack MD; 03/30/2016 5:56 PM)  HIATAL HERNIA (K44.9) Impression: She has a large sliding hernia. It appeared to be a type IV hernia on CT scan, but on upper GI and only appears to be a type I hernia. She appears to be completely asymptomatic from this. Upper endoscopy demonstrated some findings of esophagitis but no metaplasia or dysplasia on biopsy.  Plan: We discussed expected management of the hernia and talked about the very rare need for emergency surgery. We also discussed laparoscopic hiatal hernia repair and Nissen fundoplication. I explained the procedure, risks, and aftercare. Risks include but are not limited to bleeding, infection, wound problems, anesthesia, dysphagia, injury to esophagus/stomach/liver/spleen/intestine,gas bloat, diarrhea, failure to resolve symptoms, recurrent hernia rate. Strict dietary restriction were explained as well as changes in lifestyle.  She is leaning more toward conservative treatment for her esophagitis and hiatal hernia. I told her I would send Dr. Russella Dar a note and see what he felt about this given her endoscopic findings.  RIGHT INGUINAL HERNIA (K40.90) Impression: By history, this is a very suspicious for being the cause of her right lower quadrant and groin pain. When she gets the pain, she feels that not. When the knot goes away, the pain goes away.  Plan: We talked about open right inguinal hernia repair with mesh. I explained the procedure, risks, and aftercare of inguinal hernia repair.  Risks include but are not limited to bleeding, infection, wound problems, anesthesia, recurrence, bladder or intestine injury, urinary retention, testicular dysfunction, chronic pain, mesh problems.  She and her husband would like to think  about both of these issues. I've asked him to call back next week to let us know how they would like to proceed.  Avel Peace, MD

## 2016-04-06 DIAGNOSIS — K449 Diaphragmatic hernia without obstruction or gangrene: Secondary | ICD-10-CM | POA: Insufficient documentation

## 2016-04-06 DIAGNOSIS — K2100 Gastro-esophageal reflux disease with esophagitis, without bleeding: Secondary | ICD-10-CM | POA: Insufficient documentation

## 2016-04-12 ENCOUNTER — Encounter: Payer: Self-pay | Admitting: Gastroenterology

## 2016-04-19 ENCOUNTER — Telehealth: Payer: Self-pay | Admitting: Gastroenterology

## 2016-04-19 NOTE — Telephone Encounter (Signed)
Patient having continued RLQ pain.  She reports that she had an episode that lasted for 8 hours.  She will see Dr, Abbey Chattersosenbower on Wed.  She wanted to know if she should schedule with Dr. Russella DarStark also.  She is advised to wait and see what Dr. Abbey Chattersosenbower says and we can schedule follow up based on that

## 2016-04-21 ENCOUNTER — Encounter: Payer: Self-pay | Admitting: General Surgery

## 2016-04-21 NOTE — Progress Notes (Signed)
Cheryl Jones 04/21/2016 9:04 AM Location: [facility] Patient #: [id number] DOB: 12/21/1960 Married / Language: [place] / Race: White Female  History of Present Illness Cheryl Jones(Cheryl Jones J. Bartholomew Ramesh MD; 04/21/2016 10:02 AM) Patient words: right lower quadrant pain.  The patient is a 55 year old female.   Note:She presents today for followup of a painful right lateral abdominal wall knot which she had over the weekend and we discussed at that time. The knot is no longer there but she is still sore at that site. She marked the area. No symptoms from her right inguinal hernia or sliding hiatal hernia.  Allergies Milas Hock(Cheryl Jones, CMA; 04/21/2016 9:05 AM) No Known Drug Allergies 03/30/2016  Medication History Milas Hock(Cheryl Jones, CMA; 04/21/2016 9:06 AM) Meloxicam (15MG  Tablet, Oral) Active. Omeprazole (40MG  Capsule DR, Oral) Active. Valsartan-Hydrochlorothiazide (320-25MG  Tablet, Oral) Active. Omeprazole (10MG  Capsule DR, Oral) Active. Turmeric (450MG  Capsule, Oral) Active. Probiotic + Omega-3 (Oral) Active. Valsartan (40MG  Tablet, Oral) Active. Medications Reconciled    Vitals Cheryl Jones(Cheryl Jones CMA; 04/21/2016 9:06 AM) 04/21/2016 9:06 AM Weight: 150.4 lb Height: 61in Body Surface Area: 1.67 m Body Mass Index: 28.42 kg/m  Temp.: 98.19F(Oral)  Pulse: 68 (Regular)  BP: 118/70 (Sitting, Left Arm, Standard)      Physical Exam Cheryl Jones(Cheryl Jones J. Cheryl Cordy MD; 04/21/2016 10:04 AM)  The physical exam findings are as follows: Note:General-overweight female in NAD  Abd-soft, RLQ marks noted. I had her due provocative manuevers and could not demonstrate an abdominal wall mass or defect    Assessment & Plan Cheryl Jones(Cheryl Jones J. [name] MD; 04/21/2016 10:04 AM)  ABDOMINAL WALL PAIN IN RIGHT LOWER QUADRANT (R10.31) Impression: No Spigelian hernia on exam or on CT. Could be severe muscle spasm. Less likely, constipation.  Plan: Avoid heavy lifting or straining for 6 weeks. Cheryl Jones once  a day. Aleve with supper. If the hard knot comes out again, [facility].  Cheryl Peaceodd Jean Skow, MD

## 2016-06-11 ENCOUNTER — Encounter: Payer: Self-pay | Admitting: Internal Medicine

## 2016-06-11 ENCOUNTER — Ambulatory Visit (INDEPENDENT_AMBULATORY_CARE_PROVIDER_SITE_OTHER): Payer: BLUE CROSS/BLUE SHIELD | Admitting: Internal Medicine

## 2016-06-11 VITALS — BP 148/90 | HR 76 | Temp 98.5°F | Wt 153.0 lb

## 2016-06-11 DIAGNOSIS — R109 Unspecified abdominal pain: Secondary | ICD-10-CM | POA: Insufficient documentation

## 2016-06-11 DIAGNOSIS — I10 Essential (primary) hypertension: Secondary | ICD-10-CM | POA: Diagnosis not present

## 2016-06-11 DIAGNOSIS — R1011 Right upper quadrant pain: Secondary | ICD-10-CM

## 2016-06-11 MED ORDER — TRIAMTERENE-HCTZ 37.5-25 MG PO TABS: 1.0000 | ORAL_TABLET | Freq: Every day | ORAL | Status: DC

## 2016-06-11 MED ORDER — VALSARTAN 320 MG PO TABS: 320.0000 mg | ORAL_TABLET | Freq: Every day | ORAL | Status: DC

## 2016-06-11 MED ORDER — BACLOFEN 10 MG PO TABS: 10.0000 mg | ORAL_TABLET | Freq: Three times a day (TID) | ORAL | Status: DC | PRN

## 2016-06-11 MED ORDER — OXYCODONE HCL 5 MG PO TABS: 5.0000 mg | ORAL_TABLET | ORAL | Status: DC | PRN

## 2016-06-11 NOTE — Assessment & Plan Note (Signed)
D/c HCTZ (HCTZ could contribute into abd muscle cramps) - try plain Diovan 320 mg/d. Start Maxzide if BP is up.

## 2016-06-11 NOTE — Progress Notes (Signed)
Pre visit review using our clinic review tool, if applicable. No additional management support is needed unless otherwise documented below in the visit note. 

## 2016-06-11 NOTE — Assessment & Plan Note (Addendum)
Worsening sx's Likely MSK, abd rectus muscle sprain: RUQ abd pain recurrent (s/p GI eval by Dr Russella DarStark and Purnell Shoemakerosenbauer) - triggered by a physical activity - severe - lasts 30 min to 7 hrs. One can feel a hard  knot in the abd wall on the right when she is having a cramp. Oxycodone, heat help w/pain. There was some tinning of abd wall muscles noted on the R on the CT scan Multi-data reviewed D/c Percocet, given Rx for Oxy 5 mg Massage, heat, acupuncture  D/c HCTZ (HCTZ could contribute into abd muscle cramps)   Potential benefits of opioids use as well as potential risks (i.e. addiction risk, apnea etc) and complications (i.e. Somnolence, constipation and others) were explained to the patient and were aknowledged.

## 2016-06-11 NOTE — Progress Notes (Signed)
Subjective:  Patient ID: Cheryl Jones, female    DOB: 1961/06/20  Age: 55 y.o. MRN: [id number]  CC: Abdominal Pain   HPI Cheryl Jones presents for  RUQ abd pain recurrent (s/p GI eval by Dr Russella DarStark and Purnell Shoemakerosenbauer) - triggered by a physical activity - severe - lasts 30 min to 7 hrs. One can feel a hard  knot in the abd wall on the right when she is having a cramp. Oxycodone, heat help w/pain. There was some tinning of abd wall muscles noted on the R on the CT scan The [place] on [place]  Outpatient Prescriptions Prior to Visit  Medication Sig Dispense Refill  . Cholecalciferol (VITAMIN D PO) Take 1 tablet by mouth daily.    . hyoscyamine (LEVSIN/SL) 0.125 MG SL tablet Place 1 tablet (0.125 mg total) under the tongue every 4 (four) hours as needed. 30 tablet 2  . loratadine (CLARITIN) 10 MG tablet Take 10 mg by mouth daily.      Marland Kitchen. MAGNESIUM PO Take 1 tablet by mouth daily.    . meloxicam (MOBIC) 15 MG tablet TAKE 1 TABLET (15 MG TOTAL) BY MOUTH DAILY AS NEEDED FOR PAIN. 90 tablet 0  . Nutritional Supplements (JUICE PLUS FIBRE PO) Take 2 tablets by mouth daily.    Marland Kitchen. omeprazole (PRILOSEC) 40 MG capsule Take 1 capsule (40 mg total) by mouth daily. 90 capsule 3  . oxyCODONE-acetaminophen (PERCOCET/ROXICET) 5-325 MG tablet Take 1 tablet by mouth every 4 (four) hours as needed for severe pain. 15 tablet 0  . Probiotic Product (PROBIOTIC DAILY PO) Take by mouth daily.    . TURMERIC PO Take 1 tablet by mouth daily.    . valsartan-hydrochlorothiazide (DIOVAN-HCT) 320-25 MG per tablet Take 1 tablet by mouth daily. 90 tablet 3   No facility-administered medications prior to visit.    ROS Review of Systems  Constitutional: Negative for chills, activity change, appetite change, fatigue and unexpected weight change.  HENT: Negative for congestion, mouth sores and sinus pressure.   Eyes: Negative for visual disturbance.  Respiratory: Negative for cough and chest tightness.     Gastrointestinal: Positive for abdominal pain. Negative for nausea, vomiting, diarrhea, constipation, blood in stool, abdominal distention, anal bleeding and rectal pain.  Genitourinary: Negative for frequency, difficulty urinating and vaginal pain.  Musculoskeletal: Negative for back pain and gait problem.  Skin: Negative for pallor and rash.  Neurological: Negative for dizziness, tremors, weakness, numbness and headaches.  Psychiatric/Behavioral: Negative for confusion and sleep disturbance.    Objective:  BP 148/90 mmHg  Pulse 76  Temp(Src) 98.5 F (36.9 C) (Oral)  Wt 153 lb (69.4 kg)  SpO2 97%  BP Readings from Last 3 Encounters:  06/11/16 148/90  03/11/16 127/82  03/10/16 120/74    Wt Readings from Last 3 Encounters:  06/11/16 153 lb (69.4 kg)  03/11/16 154 lb (69.854 kg)  03/10/16 154 lb 3.2 oz (69.945 kg)    Physical Exam  Constitutional: She appears well-developed. No distress.  HENT:  Head: Normocephalic.  Right Ear: External ear normal.  Left Ear: External ear normal.  Nose: Nose normal.  Mouth/Throat: Oropharynx is clear and moist.  Eyes: Conjunctivae are normal. Pupils are equal, round, and reactive to light. Right eye exhibits no discharge. Left eye exhibits no discharge.  Neck: Normal range of motion. Neck supple. No JVD present. No tracheal deviation present. No thyromegaly present.  Cardiovascular: Normal rate, regular rhythm and normal heart sounds.   Pulmonary/Chest: No  stridor. No respiratory distress. She has no wheezes.  Abdominal: Soft. Bowel sounds are normal. She exhibits no distension and no mass. There is no tenderness. There is no rebound and no guarding.  Musculoskeletal: She exhibits no edema or tenderness.  Lymphadenopathy:    She has no cervical adenopathy.  Neurological: She displays normal reflexes. No cranial nerve deficit. She exhibits normal muscle tone. Coordination normal.  Skin: No rash noted. No erythema.  Psychiatric: She has a  normal mood and affect. Her behavior is normal. Judgment and thought content normal.    Lab Results  Component Value Date   WBC 10.7* 01/30/2016   HGB 14.1 01/30/2016   HCT 40.8 01/30/2016   PLT 225 01/30/2016   GLUCOSE 107* 01/30/2016   CHOL 261* 08/08/2015   TRIG 145.0 08/08/2015   HDL 48.30 08/08/2015   LDLDIRECT 160.9 04/20/2013   LDLCALC 183* 08/08/2015   ALT 13* 01/30/2016   AST 17 01/30/2016   NA 139 01/30/2016   K 4.6 01/30/2016   CL 105 01/30/2016   CREATININE 0.73 01/30/2016   BUN 15 01/30/2016   CO2 25 01/30/2016   TSH 0.60 08/08/2015    Dg Ugi W/high Density W/kub  03/19/2016  CLINICAL DATA:  Right lower quadrant abdominal pain. Hiatal hernia. Distal esophagitis. EXAM: UPPER GI SERIES WITH KUB TECHNIQUE: After obtaining a scout radiograph a routine upper GI series was performed using thin and high density barium. FLUOROSCOPY TIME:  Radiation Exposure Index (as provided by the fluoroscopic device): 563 microGy*m^2 COMPARISON:  02/18/2016 FINDINGS: Initial KUB demonstrates a hernia mesh markers in the left lower quadrant. The pharyngeal phase of swallowing appears normal. Double contrast portion of the exam demonstrates no esophageal stricture or ulceration. Moderate to large hiatal hernia noted; currently this is smaller than on the prior CT scan, and type 1. Primary peristaltic waves or normal on 4/4 swallows. A 13 mm barium tablet passed without difficulty into the stomach. The gastritis observed on recent endoscopy is not readily apparent. Normal duodenal bulb and proximal duodenum. Normal duodenal configuration. No mass identified. IMPRESSION: 1. No ulceration of the stomach or esophagus currently identified. Moderate to large hiatal hernia, type 1, although smaller than on the recent CT scan. 2. No findings of dysmotility. The pharyngeal phase of swallowing appeared normal. Electronically Signed   By: Gaylyn RongWalter  Liebkemann M.D.   On: 03/19/2016 12:22    Assessment & Plan:    There are no diagnoses linked to this encounter. I am having Ms. Badal maintain her loratadine, Nutritional Supplements (JUICE PLUS FIBRE PO), Probiotic Product (PROBIOTIC DAILY PO), valsartan-hydrochlorothiazide, meloxicam, oxyCODONE-acetaminophen, hyoscyamine, Cholecalciferol (VITAMIN D PO), TURMERIC PO, MAGNESIUM PO, and omeprazole.  No orders of the defined types were placed in this encounter.     Follow-up: No Follow-up on file.  Sonda PrimesAlex Plotnikov, MD

## 2016-08-06 DIAGNOSIS — N200 Calculus of kidney: Secondary | ICD-10-CM | POA: Diagnosis not present

## 2016-08-16 IMAGING — RF DG UGI W/ HIGH DENSITY W/KUB
17 series · 17 of 24 positions shown · non-contrast
Comparison: 02/18/2016

CLINICAL DATA: Right lower quadrant abdominal pain. Hiatal hernia.
Distal esophagitis.

EXAM:
UPPER GI SERIES WITH KUB
TECHNIQUE: After obtaining a scout radiograph a routine upper GI series was
performed using thin and high density barium.
FLUOROSCOPY TIME:  Radiation Exposure Index (as provided by the
fluoroscopic device): 563 microGy*m^2

[Series 1: t abdomen supine · 0.15mm/px · 1 of 1 slices shown]
[im 1/1]
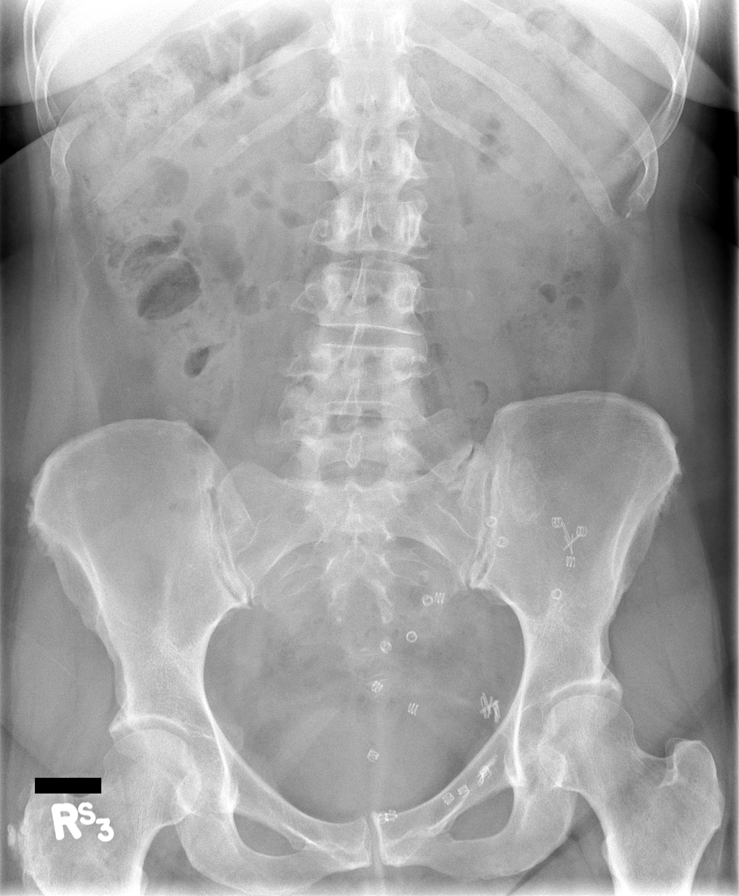

[Series 2: cp_standard · 0.34mm/px · 1 of 26 frames shown (1 of 12)]
[frame 11/26]
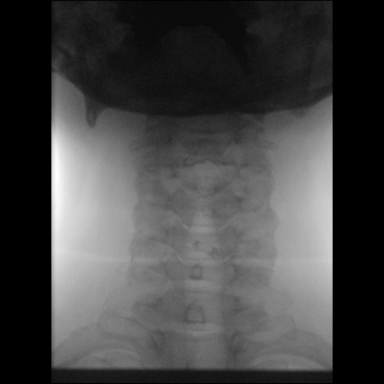

[Series 3: cp_standard · 0.34mm/px · 1 of 38 frames shown (2 of 12)]
[frame 20/38]
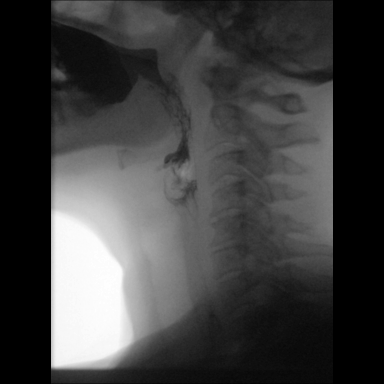

[Series 4: cp_standard · 0.34mm/px · 1 of 67 frames shown (3 of 12)]
[frame 64/67]
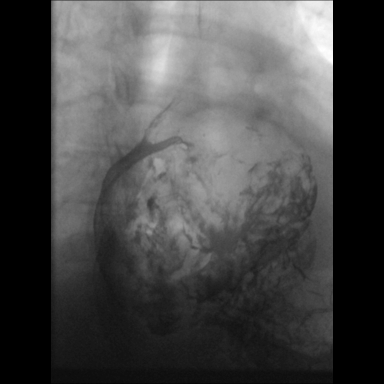

[Series 6: fluoro_barium 2fps_bw · 0.18mm/px · 1 of 2 frames shown (1 of 4)]
[frame 2/2]
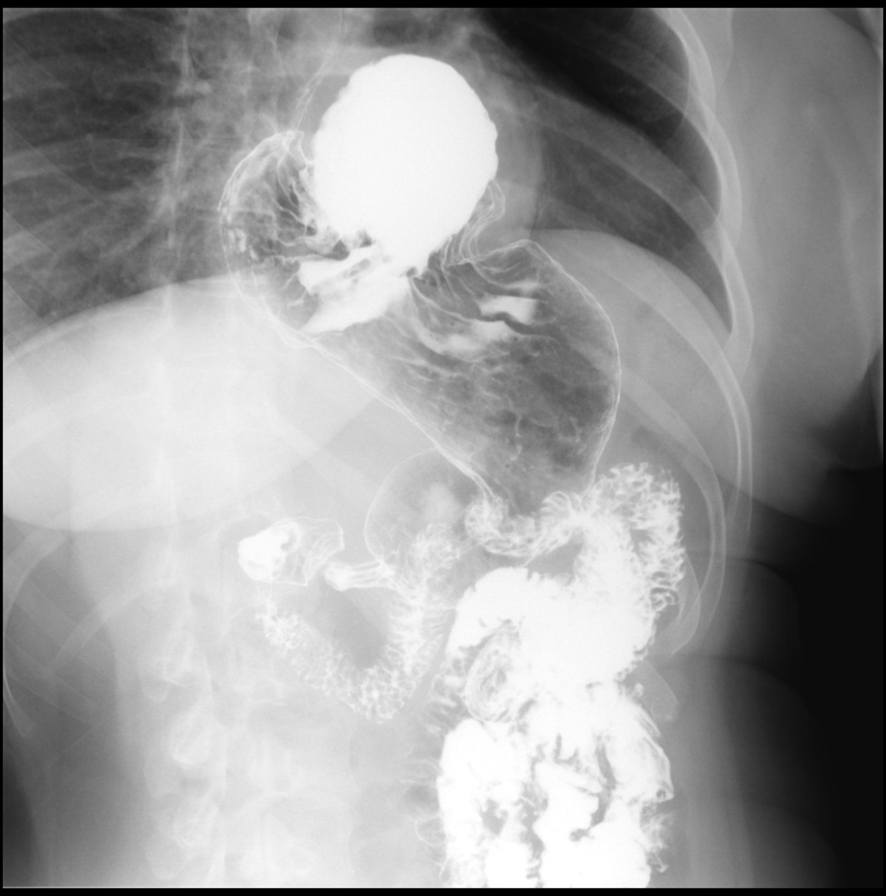

[Series 7: fluoro_barium 2fps_bw · 0.18mm/px · 1 of 2 frames shown (2 of 4)]
[frame 2/2]
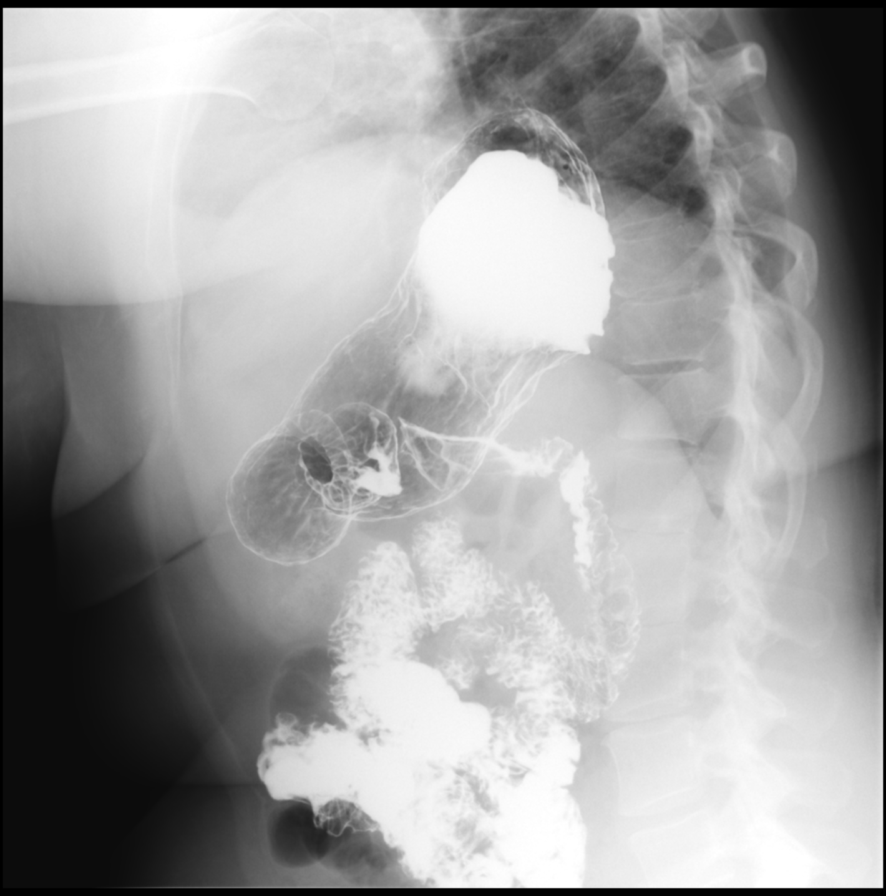

[Series 9: cp_standard · 0.36mm/px · 1 of 26 frames shown (4 of 12)]
[frame 23/26]
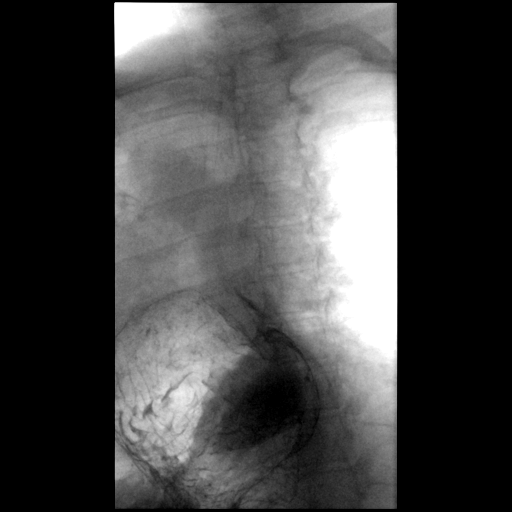

[Series 10: cp_standard · 0.36mm/px · 1 of 27 frames shown (5 of 12)]
[frame 14/27]
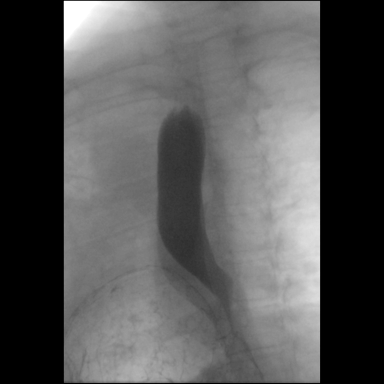

[Series 11: cp_standard · 0.36mm/px · 1 of 26 frames shown (6 of 12)]
[frame 16/26]
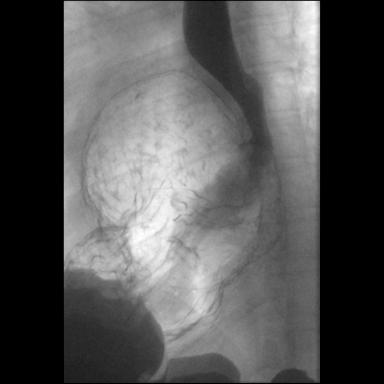

[Series 12: cp_standard · 0.36mm/px · 1 of 25 frames shown (7 of 12)]
[frame 22/25]
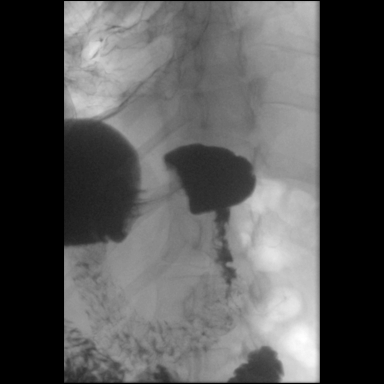

[Series 13: cp_standard · 0.36mm/px · 1 of 22 frames shown (8 of 12)]
[frame 14/22]
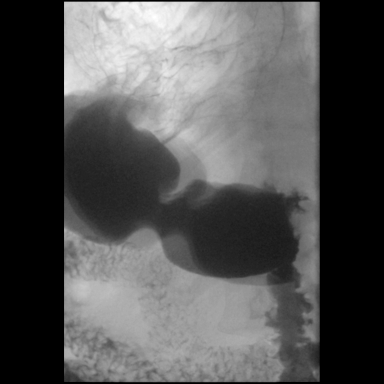

[Series 14: fluoro_barium 2fps_bw · 0.18mm/px · 1 of 1 slices shown (3 of 4)]
[im 1/1]
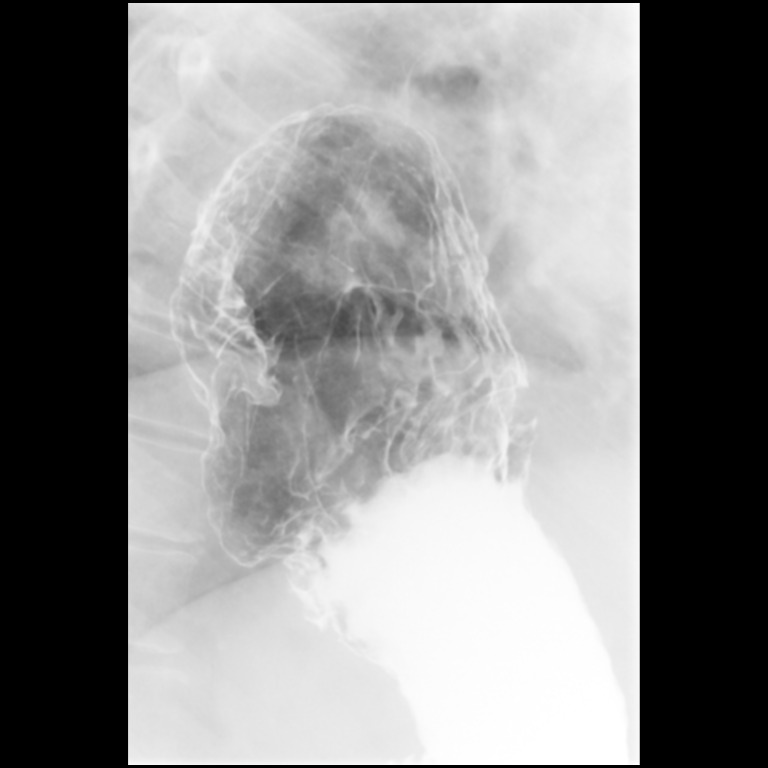

[Series 15: cp_standard · 0.36mm/px · 1 of 18 frames shown (9 of 12)]
[frame 10/18]
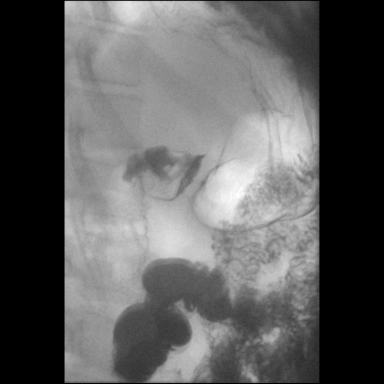

[Series 16: cp_standard · 0.36mm/px · 1 of 25 frames shown (10 of 12)]
[frame 22/25]
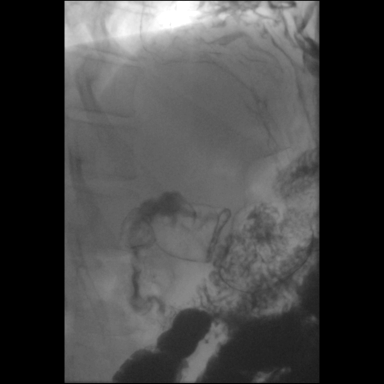

[Series 17: cp_standard · 0.36mm/px · 1 of 20 frames shown (11 of 12)]
[frame 11/20]
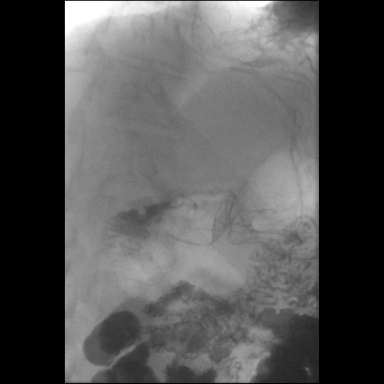

[Series 18: fluoro_barium 2fps_bw · 0.18mm/px · 1 of 1 slices shown (4 of 4)]
[im 1/1]
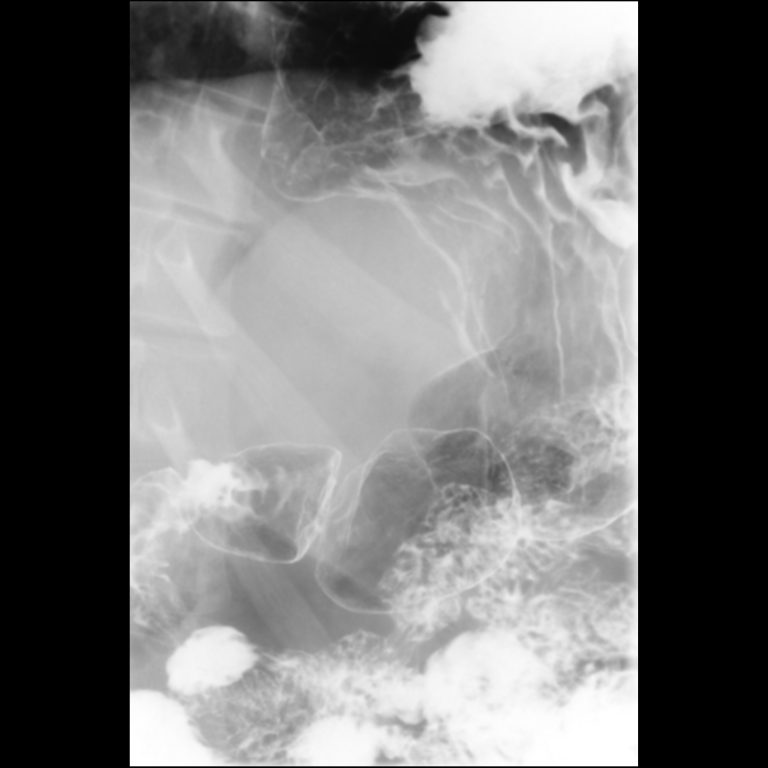

[Series 20: cp_standard · 0.36mm/px · 1 of 15 frames shown (12 of 12)]
[frame 13/15]
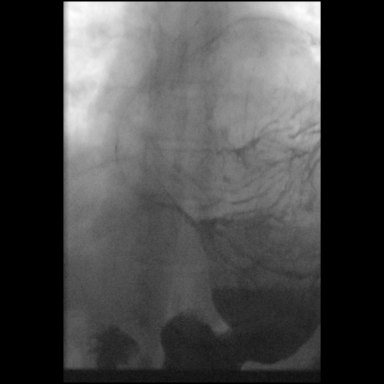

[17 of 24 positions shown; findings below may reference images not displayed]

FINDINGS: Initial KUB demonstrates a hernia mesh markers in the left lower
quadrant.

The pharyngeal phase of swallowing appears normal.

Double contrast portion of the exam demonstrates no esophageal
stricture or ulceration. Moderate to large hiatal hernia noted;
currently this is smaller than on the prior CT scan, and type 1.
Primary peristaltic waves or normal on [DATE] swallows. A 13 mm barium
tablet passed without difficulty into the stomach.

The gastritis observed on recent endoscopy is not readily apparent.
Normal duodenal bulb and proximal duodenum. Normal duodenal
configuration. No mass identified.
IMPRESSION: 1. No ulceration of the stomach or esophagus currently identified.
Moderate to large hiatal hernia, type 1, although smaller than on
the recent CT scan.
2. No findings of dysmotility. The pharyngeal phase of swallowing
appeared normal.

## 2016-08-18 DIAGNOSIS — H35033 Hypertensive retinopathy, bilateral: Secondary | ICD-10-CM | POA: Diagnosis not present

## 2016-08-21 ENCOUNTER — Other Ambulatory Visit: Payer: Self-pay | Admitting: Internal Medicine

## 2016-08-30 DIAGNOSIS — N915 Oligomenorrhea, unspecified: Secondary | ICD-10-CM | POA: Diagnosis not present

## 2016-09-13 ENCOUNTER — Other Ambulatory Visit: Payer: Self-pay | Admitting: *Deleted

## 2016-09-13 MED ORDER — VALSARTAN-HYDROCHLOROTHIAZIDE 320-25 MG PO TABS
1.0000 | ORAL_TABLET | Freq: Every day | ORAL | 0 refills | Status: DC
Start: 2016-09-13 — End: 2017-05-05

## 2016-10-07 DIAGNOSIS — Z01419 Encounter for gynecological examination (general) (routine) without abnormal findings: Secondary | ICD-10-CM | POA: Diagnosis not present

## 2016-10-07 DIAGNOSIS — Z1231 Encounter for screening mammogram for malignant neoplasm of breast: Secondary | ICD-10-CM | POA: Diagnosis not present

## 2016-10-07 DIAGNOSIS — Z3A29 29 weeks gestation of pregnancy: Secondary | ICD-10-CM | POA: Diagnosis not present

## 2016-10-11 DIAGNOSIS — B079 Viral wart, unspecified: Secondary | ICD-10-CM | POA: Diagnosis not present

## 2016-10-11 DIAGNOSIS — R0982 Postnasal drip: Secondary | ICD-10-CM | POA: Diagnosis not present

## 2016-10-11 DIAGNOSIS — Z23 Encounter for immunization: Secondary | ICD-10-CM | POA: Diagnosis not present

## 2016-10-11 DIAGNOSIS — R0989 Other specified symptoms and signs involving the circulatory and respiratory systems: Secondary | ICD-10-CM | POA: Diagnosis not present

## 2016-10-11 DIAGNOSIS — J029 Acute pharyngitis, unspecified: Secondary | ICD-10-CM | POA: Diagnosis not present

## 2016-11-30 ENCOUNTER — Other Ambulatory Visit: Payer: Self-pay | Admitting: Internal Medicine

## 2016-12-27 ENCOUNTER — Telehealth: Payer: Self-pay | Admitting: Gastroenterology

## 2016-12-27 NOTE — Telephone Encounter (Signed)
Left message for patient to call back  

## 2016-12-28 NOTE — Telephone Encounter (Signed)
Left message for patient to call back  

## 2016-12-28 NOTE — Telephone Encounter (Signed)
Patient notified per procedure report 03/29/16 she should remain on a PPI indefinitely. All questions answered.  She will call back for any additional questions or concerns.

## 2017-01-12 ENCOUNTER — Other Ambulatory Visit: Payer: Self-pay | Admitting: Internal Medicine

## 2017-01-20 HISTORY — PX: WISDOM TOOTH EXTRACTION: SHX21

## 2017-03-12 ENCOUNTER — Other Ambulatory Visit: Payer: Self-pay | Admitting: Gastroenterology

## 2017-03-15 DIAGNOSIS — M25562 Pain in left knee: Secondary | ICD-10-CM | POA: Diagnosis not present

## 2017-03-15 DIAGNOSIS — R1031 Right lower quadrant pain: Secondary | ICD-10-CM | POA: Diagnosis not present

## 2017-03-15 DIAGNOSIS — R1011 Right upper quadrant pain: Secondary | ICD-10-CM | POA: Diagnosis not present

## 2017-03-15 DIAGNOSIS — M25551 Pain in right hip: Secondary | ICD-10-CM | POA: Diagnosis not present

## 2017-03-24 DIAGNOSIS — R1031 Right lower quadrant pain: Secondary | ICD-10-CM | POA: Diagnosis not present

## 2017-03-24 DIAGNOSIS — R1011 Right upper quadrant pain: Secondary | ICD-10-CM | POA: Diagnosis not present

## 2017-03-24 DIAGNOSIS — M25562 Pain in left knee: Secondary | ICD-10-CM | POA: Diagnosis not present

## 2017-03-24 DIAGNOSIS — M25551 Pain in right hip: Secondary | ICD-10-CM | POA: Diagnosis not present

## 2017-03-25 DIAGNOSIS — M25551 Pain in right hip: Secondary | ICD-10-CM | POA: Diagnosis not present

## 2017-03-25 DIAGNOSIS — R1031 Right lower quadrant pain: Secondary | ICD-10-CM | POA: Diagnosis not present

## 2017-03-25 DIAGNOSIS — M25562 Pain in left knee: Secondary | ICD-10-CM | POA: Diagnosis not present

## 2017-03-25 DIAGNOSIS — R1011 Right upper quadrant pain: Secondary | ICD-10-CM | POA: Diagnosis not present

## 2017-03-28 DIAGNOSIS — R1031 Right lower quadrant pain: Secondary | ICD-10-CM | POA: Diagnosis not present

## 2017-03-28 DIAGNOSIS — R1011 Right upper quadrant pain: Secondary | ICD-10-CM | POA: Diagnosis not present

## 2017-03-28 DIAGNOSIS — M25551 Pain in right hip: Secondary | ICD-10-CM | POA: Diagnosis not present

## 2017-03-28 DIAGNOSIS — M25562 Pain in left knee: Secondary | ICD-10-CM | POA: Diagnosis not present

## 2017-03-29 DIAGNOSIS — R1011 Right upper quadrant pain: Secondary | ICD-10-CM | POA: Diagnosis not present

## 2017-03-29 DIAGNOSIS — M25551 Pain in right hip: Secondary | ICD-10-CM | POA: Diagnosis not present

## 2017-03-29 DIAGNOSIS — R1031 Right lower quadrant pain: Secondary | ICD-10-CM | POA: Diagnosis not present

## 2017-03-29 DIAGNOSIS — M25562 Pain in left knee: Secondary | ICD-10-CM | POA: Diagnosis not present

## 2017-04-01 DIAGNOSIS — M25551 Pain in right hip: Secondary | ICD-10-CM | POA: Diagnosis not present

## 2017-04-01 DIAGNOSIS — M25562 Pain in left knee: Secondary | ICD-10-CM | POA: Diagnosis not present

## 2017-04-01 DIAGNOSIS — R1031 Right lower quadrant pain: Secondary | ICD-10-CM | POA: Diagnosis not present

## 2017-04-01 DIAGNOSIS — R1011 Right upper quadrant pain: Secondary | ICD-10-CM | POA: Diagnosis not present

## 2017-04-05 DIAGNOSIS — R1011 Right upper quadrant pain: Secondary | ICD-10-CM | POA: Diagnosis not present

## 2017-04-05 DIAGNOSIS — M25562 Pain in left knee: Secondary | ICD-10-CM | POA: Diagnosis not present

## 2017-04-05 DIAGNOSIS — R1031 Right lower quadrant pain: Secondary | ICD-10-CM | POA: Diagnosis not present

## 2017-04-05 DIAGNOSIS — M25551 Pain in right hip: Secondary | ICD-10-CM | POA: Diagnosis not present

## 2017-04-08 DIAGNOSIS — M25551 Pain in right hip: Secondary | ICD-10-CM | POA: Diagnosis not present

## 2017-04-08 DIAGNOSIS — R1011 Right upper quadrant pain: Secondary | ICD-10-CM | POA: Diagnosis not present

## 2017-04-08 DIAGNOSIS — M25562 Pain in left knee: Secondary | ICD-10-CM | POA: Diagnosis not present

## 2017-04-08 DIAGNOSIS — R1031 Right lower quadrant pain: Secondary | ICD-10-CM | POA: Diagnosis not present

## 2017-04-11 ENCOUNTER — Other Ambulatory Visit: Payer: Self-pay | Admitting: Internal Medicine

## 2017-04-12 DIAGNOSIS — M25551 Pain in right hip: Secondary | ICD-10-CM | POA: Diagnosis not present

## 2017-04-12 DIAGNOSIS — R1031 Right lower quadrant pain: Secondary | ICD-10-CM | POA: Diagnosis not present

## 2017-04-12 DIAGNOSIS — R1011 Right upper quadrant pain: Secondary | ICD-10-CM | POA: Diagnosis not present

## 2017-04-12 DIAGNOSIS — M25562 Pain in left knee: Secondary | ICD-10-CM | POA: Diagnosis not present

## 2017-04-14 DIAGNOSIS — R1031 Right lower quadrant pain: Secondary | ICD-10-CM | POA: Diagnosis not present

## 2017-04-14 DIAGNOSIS — M25551 Pain in right hip: Secondary | ICD-10-CM | POA: Diagnosis not present

## 2017-04-14 DIAGNOSIS — R1011 Right upper quadrant pain: Secondary | ICD-10-CM | POA: Diagnosis not present

## 2017-04-14 DIAGNOSIS — M25562 Pain in left knee: Secondary | ICD-10-CM | POA: Diagnosis not present

## 2017-04-19 DIAGNOSIS — R1031 Right lower quadrant pain: Secondary | ICD-10-CM | POA: Diagnosis not present

## 2017-04-19 DIAGNOSIS — R1011 Right upper quadrant pain: Secondary | ICD-10-CM | POA: Diagnosis not present

## 2017-04-19 DIAGNOSIS — M25562 Pain in left knee: Secondary | ICD-10-CM | POA: Diagnosis not present

## 2017-04-19 DIAGNOSIS — M25551 Pain in right hip: Secondary | ICD-10-CM | POA: Diagnosis not present

## 2017-04-20 DIAGNOSIS — R1031 Right lower quadrant pain: Secondary | ICD-10-CM | POA: Diagnosis not present

## 2017-04-20 DIAGNOSIS — R1011 Right upper quadrant pain: Secondary | ICD-10-CM | POA: Diagnosis not present

## 2017-04-20 DIAGNOSIS — M25551 Pain in right hip: Secondary | ICD-10-CM | POA: Diagnosis not present

## 2017-04-20 DIAGNOSIS — M25562 Pain in left knee: Secondary | ICD-10-CM | POA: Diagnosis not present

## 2017-04-21 ENCOUNTER — Other Ambulatory Visit: Payer: Self-pay | Admitting: Internal Medicine

## 2017-04-22 DIAGNOSIS — N6322 Unspecified lump in the left breast, upper inner quadrant: Secondary | ICD-10-CM | POA: Diagnosis not present

## 2017-04-25 ENCOUNTER — Other Ambulatory Visit: Payer: Self-pay | Admitting: Internal Medicine

## 2017-04-25 DIAGNOSIS — M25562 Pain in left knee: Secondary | ICD-10-CM | POA: Diagnosis not present

## 2017-04-25 DIAGNOSIS — M25551 Pain in right hip: Secondary | ICD-10-CM | POA: Diagnosis not present

## 2017-04-25 DIAGNOSIS — R1031 Right lower quadrant pain: Secondary | ICD-10-CM | POA: Diagnosis not present

## 2017-04-25 DIAGNOSIS — R1011 Right upper quadrant pain: Secondary | ICD-10-CM | POA: Diagnosis not present

## 2017-04-26 ENCOUNTER — Other Ambulatory Visit: Payer: Self-pay | Admitting: Obstetrics and Gynecology

## 2017-04-26 DIAGNOSIS — M25562 Pain in left knee: Secondary | ICD-10-CM | POA: Diagnosis not present

## 2017-04-26 DIAGNOSIS — N63 Unspecified lump in unspecified breast: Secondary | ICD-10-CM

## 2017-04-26 DIAGNOSIS — R1011 Right upper quadrant pain: Secondary | ICD-10-CM | POA: Diagnosis not present

## 2017-04-26 DIAGNOSIS — N6489 Other specified disorders of breast: Secondary | ICD-10-CM

## 2017-04-26 DIAGNOSIS — R1031 Right lower quadrant pain: Secondary | ICD-10-CM | POA: Diagnosis not present

## 2017-04-26 DIAGNOSIS — M25551 Pain in right hip: Secondary | ICD-10-CM | POA: Diagnosis not present

## 2017-04-28 DIAGNOSIS — H00024 Hordeolum internum left upper eyelid: Secondary | ICD-10-CM | POA: Diagnosis not present

## 2017-05-02 DIAGNOSIS — R1011 Right upper quadrant pain: Secondary | ICD-10-CM | POA: Diagnosis not present

## 2017-05-02 DIAGNOSIS — R1031 Right lower quadrant pain: Secondary | ICD-10-CM | POA: Diagnosis not present

## 2017-05-02 DIAGNOSIS — M25562 Pain in left knee: Secondary | ICD-10-CM | POA: Diagnosis not present

## 2017-05-02 DIAGNOSIS — M25551 Pain in right hip: Secondary | ICD-10-CM | POA: Diagnosis not present

## 2017-05-05 ENCOUNTER — Ambulatory Visit (INDEPENDENT_AMBULATORY_CARE_PROVIDER_SITE_OTHER): Payer: BLUE CROSS/BLUE SHIELD | Admitting: Internal Medicine

## 2017-05-05 ENCOUNTER — Encounter: Payer: Self-pay | Admitting: Gastroenterology

## 2017-05-05 ENCOUNTER — Ambulatory Visit (INDEPENDENT_AMBULATORY_CARE_PROVIDER_SITE_OTHER): Payer: BLUE CROSS/BLUE SHIELD | Admitting: Gastroenterology

## 2017-05-05 ENCOUNTER — Other Ambulatory Visit (INDEPENDENT_AMBULATORY_CARE_PROVIDER_SITE_OTHER): Payer: BLUE CROSS/BLUE SHIELD

## 2017-05-05 ENCOUNTER — Other Ambulatory Visit: Payer: BLUE CROSS/BLUE SHIELD

## 2017-05-05 ENCOUNTER — Encounter: Payer: Self-pay | Admitting: Internal Medicine

## 2017-05-05 VITALS — BP 100/70 | HR 68 | Ht 61.0 in | Wt 149.2 lb

## 2017-05-05 VITALS — BP 120/82 | HR 72 | Temp 97.6°F | Ht 61.0 in | Wt 153.0 lb

## 2017-05-05 DIAGNOSIS — K2100 Gastro-esophageal reflux disease with esophagitis, without bleeding: Secondary | ICD-10-CM

## 2017-05-05 DIAGNOSIS — R1011 Right upper quadrant pain: Secondary | ICD-10-CM

## 2017-05-05 DIAGNOSIS — Z0001 Encounter for general adult medical examination with abnormal findings: Secondary | ICD-10-CM | POA: Diagnosis not present

## 2017-05-05 DIAGNOSIS — D582 Other hemoglobinopathies: Secondary | ICD-10-CM

## 2017-05-05 DIAGNOSIS — Z0000 Encounter for general adult medical examination without abnormal findings: Secondary | ICD-10-CM

## 2017-05-05 DIAGNOSIS — E876 Hypokalemia: Secondary | ICD-10-CM | POA: Diagnosis not present

## 2017-05-05 DIAGNOSIS — I10 Essential (primary) hypertension: Secondary | ICD-10-CM

## 2017-05-05 DIAGNOSIS — R1031 Right lower quadrant pain: Secondary | ICD-10-CM | POA: Diagnosis not present

## 2017-05-05 DIAGNOSIS — K449 Diaphragmatic hernia without obstruction or gangrene: Secondary | ICD-10-CM | POA: Diagnosis not present

## 2017-05-05 LAB — BASIC METABOLIC PANEL
BUN: 17 mg/dL (ref 6–23)
CALCIUM: 9.7 mg/dL (ref 8.4–10.5)
CHLORIDE: 101 meq/L (ref 96–112)
CO2: 32 meq/L (ref 19–32)
CREATININE: 0.88 mg/dL (ref 0.40–1.20)
GFR: 70.85 mL/min (ref >60.00)
GLUCOSE: 74 mg/dL (ref 70–99)
Potassium: 3.3 mEq/L — ABNORMAL LOW (ref 3.5–5.1)
Sodium: 140 mEq/L (ref 135–145)

## 2017-05-05 LAB — CBC WITH DIFFERENTIAL/PLATELET
Basophils Absolute: 0.1 10*3/uL (ref 0.0–0.1)
Basophils Relative: 1.2 % (ref 0.0–3.0)
EOS ABS: 0.2 10*3/uL (ref 0.0–0.7)
Eosinophils Relative: 3.4 % (ref 0.0–5.0)
HCT: 44.7 % (ref 36.0–46.0)
HEMOGLOBIN: 15.3 g/dL — AB (ref 12.0–15.0)
LYMPHS ABS: 2.4 10*3/uL (ref 0.7–4.0)
Lymphocytes Relative: 40.3 % (ref 12.0–46.0)
MCHC: 34.3 g/dL (ref 30.0–36.0)
MCV: 91.8 fl (ref 78.0–100.0)
MONO ABS: 0.3 10*3/uL (ref 0.1–1.0)
Monocytes Relative: 5.5 % (ref 3.0–12.0)
NEUTROS PCT: 49.6 % (ref 43.0–77.0)
Neutro Abs: 3 10*3/uL (ref 1.4–7.7)
Platelets: 231 10*3/uL (ref 150.0–400.0)
RBC: 4.87 Mil/uL (ref 3.87–5.11)
RDW: 13.4 % (ref 11.5–15.5)
WBC: 6.1 10*3/uL (ref 4.0–10.5)

## 2017-05-05 LAB — URINALYSIS
Bilirubin Urine: NEGATIVE
Hgb urine dipstick: NEGATIVE
Ketones, ur: NEGATIVE
Leukocytes, UA: NEGATIVE
NITRITE: NEGATIVE
PH: 7.5 (ref 5.0–8.0)
Specific Gravity, Urine: <=1.005 — AB (ref 1.000–1.030)
TOTAL PROTEIN, URINE-UPE24: NEGATIVE
URINE GLUCOSE: NEGATIVE
Urobilinogen, UA: 0.2 (ref 0.0–1.0)

## 2017-05-05 LAB — HEPATIC FUNCTION PANEL
ALT: 11 U/L (ref 0–35)
AST: 16 U/L (ref 0–37)
Albumin: 4.3 g/dL (ref 3.5–5.2)
Alkaline Phosphatase: 65 U/L (ref 39–117)
BILIRUBIN DIRECT: 0.1 mg/dL (ref 0.0–0.3)
BILIRUBIN TOTAL: 0.5 mg/dL (ref 0.2–1.2)
Total Protein: 6.7 g/dL (ref 6.0–8.3)

## 2017-05-05 LAB — LIPID PANEL
CHOL/HDL RATIO: 5
CHOLESTEROL: 223 mg/dL — AB (ref 0–200)
HDL: 47.1 mg/dL (ref >39.00)
LDL CALC: 156 mg/dL — AB (ref 0–99)
NonHDL: 175.45
TRIGLYCERIDES: 96 mg/dL (ref 0.0–149.0)
VLDL: 19.2 mg/dL (ref 0.0–40.0)

## 2017-05-05 LAB — TSH: TSH: 0.29 u[IU]/mL — ABNORMAL LOW (ref 0.35–4.50)

## 2017-05-05 MED ORDER — PANTOPRAZOLE SODIUM 40 MG PO TBEC: 40.0000 mg | DELAYED_RELEASE_TABLET | Freq: Every day | ORAL | 11 refills | Status: DC

## 2017-05-05 NOTE — Assessment & Plan Note (Addendum)
Acupuncture helped w/LBP and RUQ pain ? MSK due to hard yard work: Stop blowing grass and edging 2 acres

## 2017-05-05 NOTE — Patient Instructions (Signed)
We have sent the following medications to your pharmacy for you to pick up at your convenience: pantoprazole.   You will be due for a recall upper endoscopy in 02/2019. We will send you a reminder in the mail when it gets closer to that time.   Thank you for choosing me and [facility].  Venita LickMalcolm T. Pleas KochStark, Jr., MD., Clementeen GrahamFACG

## 2017-05-05 NOTE — Progress Notes (Signed)
    History of Present Illness: This is a 56 year old female with GERD with LA Class C esophagitis and a large hiatal hernia. Imaging studies show the large hiatal hernia contains a majority of the stomach, portions of the pancreas, colon and splenic vein. Her recurrent RLQ pain resolved with physical therapy and acupuncture. No GERD symptoms at all on daily PPI. Her GERD symptoms have been minimal even off medications.   Current Medications, Allergies, Past Medical History, Past Surgical History, Family History and Social History were reviewed in [facility] electronic medical record.  Physical Exam: General: Well developed, well nourished, no acute distress Head: Normocephalic and atraumatic Eyes:  sclerae anicteric, EOMI Ears: Normal auditory acuity Mouth: No deformity or lesions Lungs: Clear throughout to auscultation Heart: Regular rate and rhythm; no murmurs, rubs or bruits Abdomen: Soft, non tender and non distended. No masses, hepatosplenomegaly or hernias noted. Normal Bowel sounds Musculoskeletal: Symmetrical with no gross deformities  Pulses:  Normal pulses noted Extremities: No clubbing, cyanosis, edema or deformities noted Neurological: Alert oriented x 4, grossly nonfocal Psychological:  Alert and cooperative. Normal mood and affect   Assessment and Recommendations:  1. GERD with LA Class C erosive esophagitis and a large HH as described above. Dr. Abbey Chattersosenbower and pt decided against a surgical repair of her large HH. She had many question about GERD mgmt, PPI medications, repeat EGD to reassess esophagitis and her large HH. Change to pantoprazole 40 mg daily and if insurance coverage is not adequate change back to omeprazole 40 mg daily.   2. RLQ pain, musculoskeletal being managed by her PCP.   3. CRC screening, average risk. Colonoscopy in 08/2023.   I spent 15 minutes of face-to-face time with the patient. Greater than 50% of the time was spent counseling and  coordinating care.

## 2017-05-05 NOTE — Patient Instructions (Signed)
Shingrix vaccine

## 2017-05-05 NOTE — Progress Notes (Signed)
Subjective:  Patient ID: Cheryl Jones, female    DOB: 1961/12/11  Age: 56 y.o. MRN: [id number]  CC: No chief complaint on file.   HPI Cheryl Jones presents for a well exam Acupuncture helped w/LBP and RUQ pain  Outpatient Medications Prior to Visit  Medication Sig Dispense Refill  . baclofen (LIORESAL) 10 MG tablet TAKE 1 TABLET BY MOUTH 3 TIMES A DAY AS NEEDED FOR MUSCLE SPASM 30 tablet 0  . Cholecalciferol (VITAMIN D PO) Take 1 tablet by mouth daily.    Marland Kitchen. loratadine (CLARITIN) 10 MG tablet Take 10 mg by mouth daily.      Marland Kitchen. MAGNESIUM PO Take 1 tablet by mouth daily.    . meloxicam (MOBIC) 15 MG tablet Take 1 tablet (15 mg total) by mouth daily as needed for pain. Overdue for annual appt must see MD for future refills 30 tablet 0  . Nutritional Supplements (JUICE PLUS FIBRE PO) Take 2 tablets by mouth daily.    Marland Kitchen. omeprazole (PRILOSEC) 40 MG capsule Take 1 capsule (40 mg total) by mouth daily. 90 capsule 3  . Probiotic Product (PROBIOTIC DAILY PO) Take by mouth daily.    . TURMERIC PO Take 1 tablet by mouth daily.    . valsartan-hydrochlorothiazide (DIOVAN-HCT) 320-25 MG tablet Take 1 tablet by mouth daily. Overdue for yearly appt w/labs must see MD for refills 30 tablet 0  . valsartan-hydrochlorothiazide (DIOVAN-HCT) 320-25 MG tablet Take 1 tablet by mouth daily. 90 tablet 0  . valsartan-hydrochlorothiazide (DIOVAN-HCT) 320-25 MG tablet Take 1 tablet by mouth daily. Yearly physical w.labs due in June must see MD for refills 90 tablet 0  . hyoscyamine (LEVSIN/SL) 0.125 MG SL tablet Place 1 tablet (0.125 mg total) under the tongue every 4 (four) hours as needed. (Patient not taking: Reported on 05/05/2017) 30 tablet 2  . oxyCODONE (ROXICODONE) 5 MG immediate release tablet Take 1 tablet (5 mg total) by mouth every 4 (four) hours as needed for severe pain. (Patient not taking: Reported on 05/05/2017) 20 tablet 0  . triamterene-hydrochlorothiazide (MAXZIDE-25) 37.5-25 MG tablet Take 1 tablet  by mouth daily. (Patient not taking: Reported on 05/05/2017) 30 tablet 11  . valsartan (DIOVAN) 320 MG tablet Take 1 tablet (320 mg total) by mouth daily. (Patient not taking: Reported on 05/05/2017) 30 tablet 11   No facility-administered medications prior to visit.     ROS Review of Systems  Constitutional: Negative for activity change, appetite change, chills, fatigue and unexpected weight change.  HENT: Negative for congestion, mouth sores and sinus pressure.   Eyes: Negative for visual disturbance.  Respiratory: Negative for cough and chest tightness.   Gastrointestinal: Negative for abdominal pain and nausea.  Genitourinary: Negative for difficulty urinating, frequency and vaginal pain.  Musculoskeletal: Positive for back pain. Negative for gait problem.  Skin: Negative for pallor and rash.  Neurological: Negative for dizziness, tremors, weakness, numbness and headaches.  Psychiatric/Behavioral: Negative for confusion and sleep disturbance.    Objective:  BP 120/82 (BP Location: Left Arm, Patient Position: Sitting, Cuff Size: Normal)   Pulse 72   Temp 97.6 F (36.4 C) (Oral)   Ht 5\' 1"  (1.549 m)   Wt 153 lb (69.4 kg)   SpO2 99%   BMI 28.91 kg/m   BP Readings from Last 3 Encounters:  05/05/17 120/82  06/11/16 (!) 148/90  03/11/16 127/82    Wt Readings from Last 3 Encounters:  05/05/17 153 lb (69.4 kg)  06/11/16 153 lb (69.4 kg)  03/11/16 154  lb (69.9 kg)    Physical Exam  Constitutional: She appears well-developed. No distress.  HENT:  Head: Normocephalic.  Right Ear: External ear normal.  Left Ear: External ear normal.  Nose: Nose normal.  Mouth/Throat: Oropharynx is clear and moist.  Eyes: Conjunctivae are normal. Pupils are equal, round, and reactive to light. Right eye exhibits no discharge. Left eye exhibits no discharge.  Neck: Normal range of motion. Neck supple. No JVD present. No tracheal deviation present. No thyromegaly present.  Cardiovascular:  Normal rate, regular rhythm and normal heart sounds.   Pulmonary/Chest: No stridor. No respiratory distress. She has no wheezes.  Abdominal: Soft. Bowel sounds are normal. She exhibits no distension and no mass. There is no tenderness. There is no rebound and no guarding.  Musculoskeletal: She exhibits tenderness. She exhibits no edema.  Lymphadenopathy:    She has no cervical adenopathy.  Neurological: She displays normal reflexes. No cranial nerve deficit. She exhibits normal muscle tone. Coordination normal.  Skin: No rash noted. No erythema.  Psychiatric: She has a normal mood and affect. Her behavior is normal. Judgment and thought content normal.    Lab Results  Component Value Date   WBC 10.7 (H) 01/30/2016   HGB 14.1 01/30/2016   HCT 40.8 01/30/2016   PLT 225 01/30/2016   GLUCOSE 107 (H) 01/30/2016   CHOL 261 (H) 08/08/2015   TRIG 145.0 08/08/2015   HDL 48.30 08/08/2015   LDLDIRECT 160.9 04/20/2013   LDLCALC 183 (H) 08/08/2015   ALT 13 (L) 01/30/2016   AST 17 01/30/2016   NA 139 01/30/2016   K 4.6 01/30/2016   CL 105 01/30/2016   CREATININE 0.73 01/30/2016   BUN 15 01/30/2016   CO2 25 01/30/2016   TSH 0.60 08/08/2015    Dg Ugi W/high Density W/kub  Result Date: 03/19/2016 CLINICAL DATA:  Right lower quadrant abdominal pain. Hiatal hernia. Distal esophagitis. EXAM: UPPER GI SERIES WITH KUB TECHNIQUE: After obtaining a scout radiograph a routine upper GI series was performed using thin and high density barium. FLUOROSCOPY TIME:  Radiation Exposure Index (as provided by the fluoroscopic device): 563 microGy*m^2 COMPARISON:  02/18/2016 FINDINGS: Initial KUB demonstrates a hernia mesh markers in the left lower quadrant. The pharyngeal phase of swallowing appears normal. Double contrast portion of the exam demonstrates no esophageal stricture or ulceration. Moderate to large hiatal hernia noted; currently this is smaller than on the prior CT scan, and type 1. Primary peristaltic  waves or normal on 4/4 swallows. A 13 mm barium tablet passed without difficulty into the stomach. The gastritis observed on recent endoscopy is not readily apparent. Normal duodenal bulb and proximal duodenum. Normal duodenal configuration. No mass identified. IMPRESSION: 1. No ulceration of the stomach or esophagus currently identified. Moderate to large hiatal hernia, type 1, although smaller than on the recent CT scan. 2. No findings of dysmotility. The pharyngeal phase of swallowing appeared normal. Electronically Signed   By: Gaylyn Rong M.D.   On: 03/19/2016 12:22    Assessment & Plan:   There are no diagnoses linked to this encounter. I have discontinued Ms. Africa's hyoscyamine, valsartan, triamterene-hydrochlorothiazide, and oxyCODONE. I am also having her maintain her loratadine, Nutritional Supplements (JUICE PLUS FIBRE PO), Probiotic Product (PROBIOTIC DAILY PO), Cholecalciferol (VITAMIN D PO), TURMERIC PO, MAGNESIUM PO, omeprazole, baclofen, meloxicam, and valsartan-hydrochlorothiazide.  No orders of the defined types were placed in this encounter.    Follow-up: No Follow-up on file.  Sonda Primes, MD

## 2017-05-09 ENCOUNTER — Encounter: Payer: Self-pay | Admitting: Internal Medicine

## 2017-05-09 ENCOUNTER — Other Ambulatory Visit: Payer: Self-pay | Admitting: Internal Medicine

## 2017-05-09 DIAGNOSIS — E876 Hypokalemia: Secondary | ICD-10-CM | POA: Insufficient documentation

## 2017-05-09 DIAGNOSIS — D582 Other hemoglobinopathies: Secondary | ICD-10-CM | POA: Insufficient documentation

## 2017-05-09 DIAGNOSIS — R7989 Other specified abnormal findings of blood chemistry: Secondary | ICD-10-CM

## 2017-05-09 MED ORDER — VALSARTAN 320 MG PO TABS: 320.0000 mg | ORAL_TABLET | Freq: Every day | ORAL | 3 refills | Status: DC

## 2017-05-09 NOTE — Assessment & Plan Note (Signed)
?  etiol - repeat CBC ?OSA

## 2017-05-09 NOTE — Assessment & Plan Note (Addendum)
D/c Diovan HCTZ Plain Diovan instead

## 2017-05-09 NOTE — Assessment & Plan Note (Signed)
D/c Diovan HCTZ Plain Diovan instead 

## 2017-05-09 NOTE — Assessment & Plan Note (Signed)
We discussed age appropriate health related issues, including available/recomended screening tests and vaccinations. We discussed a need for adhering to healthy diet and exercise. Labs were ordered to be later reviewed . All questions were answered. Shingrix discussed  

## 2017-05-10 DIAGNOSIS — M25562 Pain in left knee: Secondary | ICD-10-CM | POA: Diagnosis not present

## 2017-05-10 DIAGNOSIS — R1031 Right lower quadrant pain: Secondary | ICD-10-CM | POA: Diagnosis not present

## 2017-05-10 DIAGNOSIS — M25551 Pain in right hip: Secondary | ICD-10-CM | POA: Diagnosis not present

## 2017-05-10 DIAGNOSIS — R1011 Right upper quadrant pain: Secondary | ICD-10-CM | POA: Diagnosis not present

## 2017-05-12 DIAGNOSIS — M25551 Pain in right hip: Secondary | ICD-10-CM | POA: Diagnosis not present

## 2017-05-12 DIAGNOSIS — M25562 Pain in left knee: Secondary | ICD-10-CM | POA: Diagnosis not present

## 2017-05-12 DIAGNOSIS — R1031 Right lower quadrant pain: Secondary | ICD-10-CM | POA: Diagnosis not present

## 2017-05-12 DIAGNOSIS — R1011 Right upper quadrant pain: Secondary | ICD-10-CM | POA: Diagnosis not present

## 2017-05-17 DIAGNOSIS — R1031 Right lower quadrant pain: Secondary | ICD-10-CM | POA: Diagnosis not present

## 2017-05-17 DIAGNOSIS — R1011 Right upper quadrant pain: Secondary | ICD-10-CM | POA: Diagnosis not present

## 2017-05-17 DIAGNOSIS — M25551 Pain in right hip: Secondary | ICD-10-CM | POA: Diagnosis not present

## 2017-05-17 DIAGNOSIS — M25562 Pain in left knee: Secondary | ICD-10-CM | POA: Diagnosis not present

## 2017-05-18 DIAGNOSIS — L821 Other seborrheic keratosis: Secondary | ICD-10-CM | POA: Diagnosis not present

## 2017-05-18 DIAGNOSIS — D225 Melanocytic nevi of trunk: Secondary | ICD-10-CM | POA: Diagnosis not present

## 2017-05-18 DIAGNOSIS — I781 Nevus, non-neoplastic: Secondary | ICD-10-CM | POA: Diagnosis not present

## 2017-05-18 DIAGNOSIS — L719 Rosacea, unspecified: Secondary | ICD-10-CM | POA: Diagnosis not present

## 2017-05-19 ENCOUNTER — Other Ambulatory Visit: Payer: Self-pay | Admitting: Gastroenterology

## 2017-05-24 DIAGNOSIS — R1011 Right upper quadrant pain: Secondary | ICD-10-CM | POA: Diagnosis not present

## 2017-05-24 DIAGNOSIS — M25551 Pain in right hip: Secondary | ICD-10-CM | POA: Diagnosis not present

## 2017-05-24 DIAGNOSIS — R1031 Right lower quadrant pain: Secondary | ICD-10-CM | POA: Diagnosis not present

## 2017-05-24 DIAGNOSIS — M25562 Pain in left knee: Secondary | ICD-10-CM | POA: Diagnosis not present

## 2017-05-26 ENCOUNTER — Other Ambulatory Visit: Payer: Self-pay

## 2017-05-26 MED ORDER — OMEPRAZOLE 40 MG PO CPDR: 40.0000 mg | DELAYED_RELEASE_CAPSULE | Freq: Every day | ORAL | 3 refills | Status: DC

## 2017-05-31 DIAGNOSIS — M25562 Pain in left knee: Secondary | ICD-10-CM | POA: Diagnosis not present

## 2017-05-31 DIAGNOSIS — R1031 Right lower quadrant pain: Secondary | ICD-10-CM | POA: Diagnosis not present

## 2017-05-31 DIAGNOSIS — M25551 Pain in right hip: Secondary | ICD-10-CM | POA: Diagnosis not present

## 2017-05-31 DIAGNOSIS — R1011 Right upper quadrant pain: Secondary | ICD-10-CM | POA: Diagnosis not present

## 2017-06-21 DIAGNOSIS — M25551 Pain in right hip: Secondary | ICD-10-CM | POA: Diagnosis not present

## 2017-06-21 DIAGNOSIS — M25562 Pain in left knee: Secondary | ICD-10-CM | POA: Diagnosis not present

## 2017-06-21 DIAGNOSIS — R1031 Right lower quadrant pain: Secondary | ICD-10-CM | POA: Diagnosis not present

## 2017-06-21 DIAGNOSIS — R1011 Right upper quadrant pain: Secondary | ICD-10-CM | POA: Diagnosis not present

## 2017-06-28 DIAGNOSIS — R1031 Right lower quadrant pain: Secondary | ICD-10-CM | POA: Diagnosis not present

## 2017-06-28 DIAGNOSIS — M25551 Pain in right hip: Secondary | ICD-10-CM | POA: Diagnosis not present

## 2017-06-28 DIAGNOSIS — R1011 Right upper quadrant pain: Secondary | ICD-10-CM | POA: Diagnosis not present

## 2017-06-28 DIAGNOSIS — M25562 Pain in left knee: Secondary | ICD-10-CM | POA: Diagnosis not present

## 2017-07-11 ENCOUNTER — Telehealth: Payer: Self-pay | Admitting: Internal Medicine

## 2017-07-11 MED ORDER — OLMESARTAN MEDOXOMIL 40 MG PO TABS: 40.0000 mg | ORAL_TABLET | Freq: Every day | ORAL | 1 refills | Status: DC

## 2017-07-11 NOTE — Telephone Encounter (Signed)
Pt called stating that she received a letter from her pharmacy about the Valsartan recall. Can something else be sent in for her? She also wanted to know if she should continue taking this medication or stop and wait for the replacement?

## 2017-07-11 NOTE — Telephone Encounter (Signed)
Please advise 

## 2017-07-11 NOTE — Telephone Encounter (Signed)
Discontinue valsartan. Olmsartan Architectural technologist(Benicar) sent to pharmacy in its place. Continue to monitor blood pressure.

## 2017-07-12 DIAGNOSIS — R1031 Right lower quadrant pain: Secondary | ICD-10-CM | POA: Diagnosis not present

## 2017-07-12 DIAGNOSIS — M25551 Pain in right hip: Secondary | ICD-10-CM | POA: Diagnosis not present

## 2017-07-12 DIAGNOSIS — M25562 Pain in left knee: Secondary | ICD-10-CM | POA: Diagnosis not present

## 2017-07-12 DIAGNOSIS — R1011 Right upper quadrant pain: Secondary | ICD-10-CM | POA: Diagnosis not present

## 2017-07-12 NOTE — Telephone Encounter (Signed)
Pt.notified

## 2017-07-26 DIAGNOSIS — R1011 Right upper quadrant pain: Secondary | ICD-10-CM | POA: Diagnosis not present

## 2017-07-26 DIAGNOSIS — R1031 Right lower quadrant pain: Secondary | ICD-10-CM | POA: Diagnosis not present

## 2017-07-26 DIAGNOSIS — M25551 Pain in right hip: Secondary | ICD-10-CM | POA: Diagnosis not present

## 2017-07-26 DIAGNOSIS — M25562 Pain in left knee: Secondary | ICD-10-CM | POA: Diagnosis not present

## 2017-08-25 DIAGNOSIS — M25551 Pain in right hip: Secondary | ICD-10-CM | POA: Diagnosis not present

## 2017-08-25 DIAGNOSIS — M25562 Pain in left knee: Secondary | ICD-10-CM | POA: Diagnosis not present

## 2017-08-25 DIAGNOSIS — R1011 Right upper quadrant pain: Secondary | ICD-10-CM | POA: Diagnosis not present

## 2017-08-25 DIAGNOSIS — R1031 Right lower quadrant pain: Secondary | ICD-10-CM | POA: Diagnosis not present

## 2017-09-18 ENCOUNTER — Other Ambulatory Visit: Payer: Self-pay | Admitting: Family

## 2017-10-04 DIAGNOSIS — M25551 Pain in right hip: Secondary | ICD-10-CM | POA: Diagnosis not present

## 2017-10-04 DIAGNOSIS — R1031 Right lower quadrant pain: Secondary | ICD-10-CM | POA: Diagnosis not present

## 2017-10-04 DIAGNOSIS — R1011 Right upper quadrant pain: Secondary | ICD-10-CM | POA: Diagnosis not present

## 2017-10-04 DIAGNOSIS — M25562 Pain in left knee: Secondary | ICD-10-CM | POA: Diagnosis not present

## 2017-11-07 DIAGNOSIS — R1031 Right lower quadrant pain: Secondary | ICD-10-CM | POA: Diagnosis not present

## 2017-11-07 DIAGNOSIS — R1011 Right upper quadrant pain: Secondary | ICD-10-CM | POA: Diagnosis not present

## 2017-11-07 DIAGNOSIS — M25562 Pain in left knee: Secondary | ICD-10-CM | POA: Diagnosis not present

## 2017-11-07 DIAGNOSIS — M25551 Pain in right hip: Secondary | ICD-10-CM | POA: Diagnosis not present

## 2017-11-23 DIAGNOSIS — F438 Other reactions to severe stress: Secondary | ICD-10-CM | POA: Diagnosis not present

## 2017-11-23 DIAGNOSIS — F419 Anxiety disorder, unspecified: Secondary | ICD-10-CM | POA: Diagnosis not present

## 2017-11-25 ENCOUNTER — Other Ambulatory Visit: Payer: Self-pay | Admitting: Internal Medicine

## 2017-11-28 DIAGNOSIS — R1011 Right upper quadrant pain: Secondary | ICD-10-CM | POA: Diagnosis not present

## 2017-11-28 DIAGNOSIS — M25551 Pain in right hip: Secondary | ICD-10-CM | POA: Diagnosis not present

## 2017-11-28 DIAGNOSIS — M25562 Pain in left knee: Secondary | ICD-10-CM | POA: Diagnosis not present

## 2017-11-28 DIAGNOSIS — R1031 Right lower quadrant pain: Secondary | ICD-10-CM | POA: Diagnosis not present

## 2017-12-14 DIAGNOSIS — F438 Other reactions to severe stress: Secondary | ICD-10-CM | POA: Diagnosis not present

## 2017-12-14 DIAGNOSIS — F419 Anxiety disorder, unspecified: Secondary | ICD-10-CM | POA: Diagnosis not present

## 2017-12-19 DIAGNOSIS — F419 Anxiety disorder, unspecified: Secondary | ICD-10-CM | POA: Diagnosis not present

## 2017-12-19 DIAGNOSIS — F438 Other reactions to severe stress: Secondary | ICD-10-CM | POA: Diagnosis not present

## 2017-12-20 ENCOUNTER — Ambulatory Visit: Payer: Self-pay | Admitting: *Deleted

## 2017-12-20 ENCOUNTER — Encounter: Payer: Self-pay | Admitting: Family Medicine

## 2017-12-20 ENCOUNTER — Ambulatory Visit: Payer: BLUE CROSS/BLUE SHIELD | Admitting: Family Medicine

## 2017-12-20 DIAGNOSIS — I10 Essential (primary) hypertension: Secondary | ICD-10-CM

## 2017-12-20 MED ORDER — LOSARTAN POTASSIUM 100 MG PO TABS: 100.0000 mg | ORAL_TABLET | Freq: Every day | ORAL | 0 refills | Status: DC

## 2017-12-20 NOTE — Progress Notes (Signed)
Cheryl Jones - 57 y.o. female MRN [id number]  Date of birth: 04-16-1961  SUBJECTIVE:  Including CC & ROS.  Chief Complaint  Patient presents with  . Hypertension    Cheryl Jones is a 57 y.o. female that is presenting with elevated blood pressure. Patient states she was on Valsartan and was switched to Olmesartan. She has been taking this for a couple of months. She was having dizziness and headaches. She took some aleve and motrin with no improvement in her headaches. Her blood pressure has been measured at home at 180's/110. She denies any sensational changes. Has a family history of HTN. Does not exercise on a regular basis. She has been diagnosed with HTN for roughly 10 years.    Review of Systems  Constitutional: Negative for fever.  Respiratory: Negative for cough.   Gastrointestinal: Negative for abdominal pain.  Musculoskeletal: Negative for gait problem.  Skin: Negative for color change.  Neurological: Positive for headaches.  Hematological: Negative for adenopathy.  Psychiatric/Behavioral: Negative for agitation.    HISTORY: Past Medical, Surgical, Social, and Family History Reviewed & Updated per EMR.   Pertinent Historical Findings include:  Past Medical History:  Diagnosis Date  . DM (diabetes mellitus), gestational   . Hypertension   . Kidney stone   . Plantar fasciitis     Past Surgical History:  Procedure Laterality Date  . DILATION AND CURETTAGE OF UTERUS    . ESOPHAGEAL MANOMETRY N/A 03/29/2016   Procedure: ESOPHAGEAL MANOMETRY (EM);  Surgeon: Meryl DareMalcolm T Stark, MD;  Location: [facility];  Service: Endoscopy;  Laterality: N/A;  . HERNIA REPAIR    . INGUINAL HERNIA REPAIR  2002   right  . WISDOM TOOTH EXTRACTION  01/20/2017    No Known Allergies  Family History  Problem Relation Age of Onset  . Colon cancer Paternal Uncle [id number]  . Hyperlipidemia Mother   . Hypertension Mother   . Hyperlipidemia Father   . Hypertension Father   . Hypertension Other     . Coronary artery disease Neg Hx   . Esophageal cancer Neg Hx   . Rectal cancer Neg Hx   . Stomach cancer Neg Hx      Social History   Socioeconomic History  . Marital status: Married    Spouse name: Not on file  . Number of children: Not on file  . Years of education: Not on file  . Highest education level: Not on file  Social Needs  . Financial resource strain: Not on file  . Food insecurity - worry: Not on file  . Food insecurity - inability: Not on file  . Transportation needs - medical: Not on file  . Transportation needs - non-medical: Not on file  Occupational History  . Occupation: housewife  Tobacco Use  . Smoking status: Never Smoker  . Smokeless tobacco: Never Used  Substance and Sexual Activity  . Alcohol use: Yes    Alcohol/week: 1.8 oz    Types: 3 Glasses of wine per week  . Drug use: No  . Sexual activity: Yes  Other Topics Concern  . Not on file  Social History Narrative   Married, 2 sons   Regular exercise-yoga     PHYSICAL EXAM:  VS: BP (!) 146/98 (BP Location: Left Arm, Patient Position: Sitting, Cuff Size: Normal)   Pulse 80   Temp 97.8 F (36.6 C) (Oral)   Ht 5\' 1"  (1.549 m)   Wt 149 lb (67.6 kg)   SpO2 99%   BMI  28.15 kg/m  Physical Exam Gen: NAD, alert, cooperative with exam, well-appearing ENT: normal lips, normal nasal mucosa, tympanic membranes clear and intact bilaterally, normal oropharynx, no cervical lymphadenopathy Eye: normal EOM, normal conjunctiva and lids CV:  no edema, +2 pedal pulses, regular rate and rhythm, S1-S2   Resp: no accessory muscle use, non-labored, clear to auscultation bilaterally, no crackles or wheezes GI: no masses or tenderness, no hernia  Skin: no rashes, no areas of induration  Neuro: normal tone, normal sensation to touch Psych:  normal insight, alert and oriented MSK: Normal gait, normal strength       ASSESSMENT & PLAN:   Hypertension Uncontrolled.  - changed olmsartan to losartan  -  follow up in 2 weeks. Could consider adding amdlodipine or coreg or bystolic  - can consider getting bmp at follow up

## 2017-12-20 NOTE — Telephone Encounter (Signed)
Patient is calling to report that she was using the recalled Losartan/HCTZ and was changed to a different BP medication- Olmesarten. She reports that she had some dizziness at first so she started taking it at night. She had a headache yesterday evening and so she checked her BP this morning and it is elevated. She does not want to go to the ED- so appointment made for acute visit to assess BP today. She is to bring her home machine in to make sure her reading are correct. Reason for Disposition . [1] Systolic BP  >= 200 OR Diastolic >= 120  AND [2] having NO cardiac or neurologic symptoms  Answer Assessment - Initial Assessment Questions 1. BLOOD PRESSURE: "What is the blood pressure?" "Did you take at least two measurements 5 minutes apart?"     126/102- this morning                    183/110  2. ONSET: "When did you take your blood pressure?"     10:00 3. HOW: "How did you obtain the blood pressure?" (e.g., visiting nurse, automatic home BP monitor)     Automatic machine 4. HISTORY: "Do you have a history of high blood pressure?"     yes 5. MEDICATIONS: "Are you taking any medications for blood pressure?" "Have you missed any doses recently?"     BP medication was changed due to recall- patient thinks the new medication is not controlling her BP 6. OTHER SYMPTOMS: "Do you have any symptoms?" (e.g., headache, chest pain, blurred vision, difficulty breathing, weakness)     Headache, weakness 7. PREGNANCY: "Is there any chance you are pregnant?" "When was your last menstrual period?"     No cycle in 6 months- vasectomy  Protocols used: HIGH BLOOD PRESSURE-A-AH

## 2017-12-20 NOTE — Assessment & Plan Note (Signed)
Uncontrolled.  - changed olmsartan to losartan  - follow up in 2 weeks. Could consider adding amdlodipine or coreg or bystolic  - can consider getting bmp at follow up

## 2017-12-20 NOTE — Patient Instructions (Signed)
Please try the losartan.  Please let me know if you blood pressure isn't improved.  Please try to exercise on a regular basis.  Please follow up with me or Dr. Macario GoldsPlot in two weeks.

## 2017-12-23 ENCOUNTER — Other Ambulatory Visit: Payer: Self-pay | Admitting: Internal Medicine

## 2017-12-26 DIAGNOSIS — F419 Anxiety disorder, unspecified: Secondary | ICD-10-CM | POA: Diagnosis not present

## 2017-12-26 DIAGNOSIS — F438 Other reactions to severe stress: Secondary | ICD-10-CM | POA: Diagnosis not present

## 2017-12-30 ENCOUNTER — Ambulatory Visit: Payer: Self-pay | Admitting: *Deleted

## 2017-12-30 NOTE — Telephone Encounter (Signed)
  Answer Assessment - Initial Assessment Questions Patient phoned in stating she thinks she has the flu. No upper respiratory sx reported. She reports body aches, headaches since Monday. Diarrhea for 2-3 days and fever today. Used an ear scan thermometer this morning that read 104 degrees but no chills nor flushing of the face. Rechecked temperature during the call, it read 102. Suggested some home care advice including  Tylenol, fluids to prevent dehydration and brat diet until diarrhea resolves. Call back with any worsening of symptoms.   1. WORST SYMPTOM: "What is your worst symptom?" (e.g., cough, runny nose, muscle aches, headache, sore throat, fever)      Body aches 2. ONSET: "When did your flu symptoms start?"   4 -5 days ago 3. COUGH: "How bad is the cough?"       No cough 4. RESPIRATORY DISTRESS: "Describe your breathing."    normal 5. FEVER: "Do you have a fever?" If so, ask: "What is your temperature, how was it measured, and when did it start?"     104 with an ear scan thermometer this am before tylenol. Unable to get a reading with thermometer now. 6. EXPOSURE: "Were you exposed to someone with influenza?"       no 7. FLU VACCINE: "Did you get a flu shot this year?"    no 8. HIGH RISK DISEASE: "Do you any chronic medical problems?" (e.g., heart or lung disease, asthma, weak immune system, or other HIGH RISK conditions)    no 9. PREGNANCY: "Is there any chance you are pregnant?" "When was your last menstrual period?"   no 10. OTHER SYMPTOMS: "Do you have any other symptoms?"  (e.g., runny nose, muscle aches, headache, sore throat)       Diarrhea 2-3 days. About 3 watery stools daily. Headaches daily. Changed diet recently to diet.  Protocols used: INFLUENZA - SEASONAL-A-AH

## 2018-01-02 ENCOUNTER — Encounter (HOSPITAL_BASED_OUTPATIENT_CLINIC_OR_DEPARTMENT_OTHER): Payer: Self-pay

## 2018-01-02 ENCOUNTER — Other Ambulatory Visit: Payer: Self-pay

## 2018-01-02 ENCOUNTER — Other Ambulatory Visit (INDEPENDENT_AMBULATORY_CARE_PROVIDER_SITE_OTHER): Payer: BLUE CROSS/BLUE SHIELD

## 2018-01-02 ENCOUNTER — Encounter: Payer: Self-pay | Admitting: Internal Medicine

## 2018-01-02 ENCOUNTER — Emergency Department (HOSPITAL_BASED_OUTPATIENT_CLINIC_OR_DEPARTMENT_OTHER): Payer: BLUE CROSS/BLUE SHIELD

## 2018-01-02 ENCOUNTER — Ambulatory Visit: Payer: BLUE CROSS/BLUE SHIELD | Admitting: Internal Medicine

## 2018-01-02 ENCOUNTER — Telehealth: Payer: Self-pay | Admitting: Internal Medicine

## 2018-01-02 ENCOUNTER — Emergency Department (HOSPITAL_BASED_OUTPATIENT_CLINIC_OR_DEPARTMENT_OTHER)
Admission: EM | Admit: 2018-01-02 | Discharge: 2018-01-02 | Disposition: A | Discharge status: Home / Self Care | Payer: BLUE CROSS/BLUE SHIELD | Attending: Emergency Medicine | Admitting: Emergency Medicine

## 2018-01-02 VITALS — BP 160/84 | HR 87 | Temp 98.7°F | Ht 61.0 in | Wt 151.1 lb

## 2018-01-02 DIAGNOSIS — R197 Diarrhea, unspecified: Secondary | ICD-10-CM | POA: Diagnosis not present

## 2018-01-02 DIAGNOSIS — R1031 Right lower quadrant pain: Secondary | ICD-10-CM | POA: Diagnosis not present

## 2018-01-02 DIAGNOSIS — N201 Calculus of ureter: Secondary | ICD-10-CM | POA: Insufficient documentation

## 2018-01-02 DIAGNOSIS — R638 Other symptoms and signs concerning food and fluid intake: Secondary | ICD-10-CM | POA: Insufficient documentation

## 2018-01-02 DIAGNOSIS — R51 Headache: Secondary | ICD-10-CM | POA: Insufficient documentation

## 2018-01-02 DIAGNOSIS — R509 Fever, unspecified: Secondary | ICD-10-CM | POA: Diagnosis not present

## 2018-01-02 DIAGNOSIS — Z79899 Other long term (current) drug therapy: Secondary | ICD-10-CM | POA: Insufficient documentation

## 2018-01-02 DIAGNOSIS — M791 Myalgia, unspecified site: Secondary | ICD-10-CM | POA: Diagnosis not present

## 2018-01-02 DIAGNOSIS — I10 Essential (primary) hypertension: Secondary | ICD-10-CM | POA: Insufficient documentation

## 2018-01-02 DIAGNOSIS — F438 Other reactions to severe stress: Secondary | ICD-10-CM | POA: Diagnosis not present

## 2018-01-02 DIAGNOSIS — R110 Nausea: Secondary | ICD-10-CM | POA: Insufficient documentation

## 2018-01-02 DIAGNOSIS — R030 Elevated blood-pressure reading, without diagnosis of hypertension: Secondary | ICD-10-CM

## 2018-01-02 DIAGNOSIS — N179 Acute kidney failure, unspecified: Secondary | ICD-10-CM | POA: Insufficient documentation

## 2018-01-02 DIAGNOSIS — F419 Anxiety disorder, unspecified: Secondary | ICD-10-CM | POA: Diagnosis not present

## 2018-01-02 DIAGNOSIS — R109 Unspecified abdominal pain: Secondary | ICD-10-CM | POA: Diagnosis not present

## 2018-01-02 LAB — COMPREHENSIVE METABOLIC PANEL
ALT: 46 U/L — AB (ref 0–35)
AST: 38 U/L — ABNORMAL HIGH (ref 0–37)
Albumin: 3 g/dL — ABNORMAL LOW (ref 3.5–5.2)
Alkaline Phosphatase: 104 U/L (ref 39–117)
BILIRUBIN TOTAL: 0.6 mg/dL (ref 0.2–1.2)
BUN: 38 mg/dL — ABNORMAL HIGH (ref 6–23)
CALCIUM: 8.8 mg/dL (ref 8.4–10.5)
CO2: 26 mEq/L (ref 19–32)
Chloride: 102 mEq/L (ref 96–112)
Creatinine, Ser: 1.77 mg/dL — ABNORMAL HIGH (ref 0.40–1.20)
GFR: 31.56 mL/min — AB (ref >60.00)
Glucose, Bld: 101 mg/dL — ABNORMAL HIGH (ref 70–99)
POTASSIUM: 4.2 meq/L (ref 3.5–5.1)
Sodium: 137 mEq/L (ref 135–145)
Total Protein: 6.3 g/dL (ref 6.0–8.3)

## 2018-01-02 LAB — COMPREHENSIVE METABOLIC PANEL WITH GFR
ALT: 48 U/L (ref 14–54)
AST: 41 U/L (ref 15–41)
Albumin: 2.4 g/dL — ABNORMAL LOW (ref 3.5–5.0)
Alkaline Phosphatase: 106 U/L (ref 38–126)
Anion gap: 9 (ref 5–15)
BUN: 32 mg/dL — ABNORMAL HIGH (ref 6–20)
CO2: 21 mmol/L — ABNORMAL LOW (ref 22–32)
Calcium: 7.7 mg/dL — ABNORMAL LOW (ref 8.9–10.3)
Chloride: 107 mmol/L (ref 101–111)
Creatinine, Ser: 1.55 mg/dL — ABNORMAL HIGH (ref 0.44–1.00)
GFR calc Af Amer: 42 mL/min — ABNORMAL LOW
GFR calc non Af Amer: 37 mL/min — ABNORMAL LOW
Glucose, Bld: 109 mg/dL — ABNORMAL HIGH (ref 65–99)
Potassium: 3.5 mmol/L (ref 3.5–5.1)
Sodium: 137 mmol/L (ref 135–145)
Total Bilirubin: 0.5 mg/dL (ref 0.3–1.2)
Total Protein: 6 g/dL — ABNORMAL LOW (ref 6.5–8.1)

## 2018-01-02 LAB — URINALYSIS, ROUTINE W REFLEX MICROSCOPIC
BILIRUBIN URINE: NEGATIVE
KETONES UR: NEGATIVE
LEUKOCYTES UA: NEGATIVE
Nitrite: NEGATIVE
SPECIFIC GRAVITY, URINE: 1.01 (ref 1.000–1.030)
Total Protein, Urine: 100 — AB
URINE GLUCOSE: NEGATIVE
UROBILINOGEN UA: 0.2 (ref 0.0–1.0)
pH: 6 (ref 5.0–8.0)

## 2018-01-02 LAB — CBC
HCT: 36.4 % (ref 36.0–46.0)
Hemoglobin: 12.2 g/dL (ref 12.0–15.0)
MCHC: 33.4 g/dL (ref 30.0–36.0)
MCV: 90.9 fl (ref 78.0–100.0)
PLATELETS: 193 10*3/uL (ref 150.0–400.0)
RBC: 4.01 Mil/uL (ref 3.87–5.11)
RDW: 13.8 % (ref 11.5–15.5)
WBC: 14.2 10*3/uL — AB (ref 4.0–10.5)

## 2018-01-02 LAB — LIPASE: Lipase: 45 U/L (ref 11.0–59.0)

## 2018-01-02 LAB — HEMOGLOBIN A1C: Hgb A1c MFr Bld: 5.7 % (ref 4.6–6.5)

## 2018-01-02 MED ORDER — SODIUM CHLORIDE 0.9 % IV BOLUS (SEPSIS)
2000.0000 mL | Freq: Once | INTRAVENOUS | Status: AC
  Administered 2018-01-02: 2000 mL via INTRAVENOUS

## 2018-01-02 MED ORDER — HYDROCODONE-ACETAMINOPHEN 5-325 MG PO TABS: 2.0000 | ORAL_TABLET | ORAL | 0 refills | Status: DC | PRN

## 2018-01-02 MED ORDER — ONDANSETRON HCL 4 MG PO TABS: 4.0000 mg | ORAL_TABLET | Freq: Three times a day (TID) | ORAL | 0 refills | Status: DC | PRN

## 2018-01-02 NOTE — Patient Instructions (Signed)
It is okay to use imodium. We have called in the nausea medicine to use if needed.    Food Choices to Help Relieve Diarrhea, Adult When you have diarrhea, the foods you eat and your eating habits are very important. Choosing the right foods and drinks can help:  Relieve diarrhea.  Replace lost fluids and nutrients.  Prevent dehydration.  What general guidelines should I follow? Relieving diarrhea  Choose foods with less than 2 g or .07 oz. of fiber per serving.  Limit fats to less than 8 tsp (38 g or 1.34 oz.) a day.  Avoid the following: ? Foods and beverages sweetened with high-fructose corn syrup, honey, or sugar alcohols such as xylitol, sorbitol, and mannitol. ? Foods that contain a lot of fat or sugar. ? Fried, greasy, or spicy foods. ? High-fiber grains, breads, and cereals. ? Raw fruits and vegetables.  Eat foods that are rich in probiotics. These foods include dairy products such as yogurt and fermented milk products. They help increase healthy bacteria in the stomach and intestines (gastrointestinal tract, or GI tract).  If you have lactose intolerance, avoid dairy products. These may make your diarrhea worse.  Take medicine to help stop diarrhea (antidiarrheal medicine) only as told by your health care provider. Replacing nutrients  Eat small meals or snacks every 3-4 hours.  Eat bland foods, such as white rice, toast, or baked potato, until your diarrhea starts to get better. Gradually reintroduce nutrient-rich foods as tolerated or as told by your health care provider. This includes: ? Well-cooked protein foods. ? Peeled, seeded, and soft-cooked fruits and vegetables. ? Low-fat dairy products.  Take vitamin and mineral supplements as told by your health care provider. Preventing dehydration   Start by sipping water or a special solution to prevent dehydration (oral rehydration solution, ORS). Urine that is clear or pale yellow means that you are getting enough  fluid.  Try to drink at least 8-10 cups of fluid each day to help replace lost fluids.  You may add other liquids in addition to water, such as clear juice or decaffeinated sports drinks, as tolerated or as told by your health care provider.  Avoid drinks with caffeine, such as coffee, tea, or soft drinks.  Avoid alcohol. What foods are recommended? The items listed may not be a complete list. Talk with your health care provider about what dietary choices are best for you. Grains White rice. White, [place]French, or pita breads (fresh or toasted), including plain rolls, buns, or bagels. White pasta. Saltine, soda, or graham crackers. Pretzels. Low-fiber cereal. Cooked cereals made with water (such as cornmeal, farina, or cream cereals). Plain muffins. Matzo. Melba toast. Zwieback. Vegetables Potatoes (without the skin). Most well-cooked and canned vegetables without skins or seeds. Tender lettuce. Fruits Apple sauce. Fruits canned in juice. Cooked apricots, cherries, grapefruit, peaches, pears, or plums. Fresh bananas and cantaloupe. Meats and other protein foods Baked or boiled chicken. Eggs. Tofu. Fish. Seafood. Smooth nut butters. Ground or well-cooked tender beef, ham, veal, lamb, pork, or poultry. Dairy Plain yogurt, kefir, and unsweetened liquid yogurt. Lactose-free milk, buttermilk, skim milk, or soy milk. Low-fat or nonfat hard cheese. Beverages Water. Low-calorie sports drinks. Fruit juices without pulp. Strained tomato and vegetable juices. Decaffeinated teas. Sugar-free beverages not sweetened with sugar alcohols. Oral rehydration solutions, if approved by your health care provider. Seasoning and other foods Bouillon, broth, or soups made from recommended foods. What foods are not recommended? The items listed may not be a complete  list. Talk with your health care provider about what dietary choices are best for you. Grains Whole grain, whole wheat, bran, or rye breads, rolls, pastas,  and crackers. Wild or brown rice. Whole grain or bran cereals. Barley. Oats and oatmeal. Corn tortillas or taco shells. Granola. Popcorn. Vegetables Raw vegetables. Fried vegetables. Cabbage, broccoli, Brussels sprouts, artichokes, baked beans, beet greens, corn, kale, legumes, peas, sweet potatoes, and yams. Potato skins. Cooked spinach and cabbage. Fruits Dried fruit, including raisins and dates. Raw fruits. Stewed or dried prunes. Canned fruits with syrup. Meat and other protein foods Fried or fatty meats. Deli meats. Chunky nut butters. Nuts and seeds. Beans and lentils. Tomasa Blase. Hot dogs. Sausage. Dairy High-fat cheeses. Whole milk, chocolate milk, and beverages made with milk, such as milk shakes. Half-and-half. Cream. sour cream. Ice cream. Beverages Caffeinated beverages (such as coffee, tea, soda, or energy drinks). Alcoholic beverages. Fruit juices with pulp. Prune juice. Soft drinks sweetened with high-fructose corn syrup or sugar alcohols. High-calorie sports drinks. Fats and oils Butter. Cream sauces. Margarine. Salad oils. Plain salad dressings. Olives. Avocados. Mayonnaise. Sweets and desserts Sweet rolls, doughnuts, and sweet breads. Sugar-free desserts sweetened with sugar alcohols such as xylitol and sorbitol. Seasoning and other foods Honey. Hot sauce. Chili powder. Gravy. Cream-based or milk-based soups. Pancakes and waffles. Summary  When you have diarrhea, the foods you eat and your eating habits are very important.  Make sure you get at least 8-10 cups of fluid each day, or enough to keep your urine clear or pale yellow.  Eat bland foods and gradually reintroduce healthy, nutrient-rich foods as tolerated, or as told by your health care provider.  Avoid high-fiber, fried, greasy, or spicy foods. This information is not intended to replace advice given to you by your health care provider. Make sure you discuss any questions you have with your health care  provider. Document Released: 02/19/2004 Document Revised: 11/26/2016 Document Reviewed: 11/26/2016 Elsevier Interactive Patient Education  [employer].

## 2018-01-02 NOTE — Assessment & Plan Note (Signed)
Checking CBC, CMP, lipase, HgA1c and U/A. Rx for zofran and okay to use imodium. Miralax 5 days ago should not be causing her symptoms and would not have caused her fevers. Likely viral illness which is resolving gradually. Talked to her about probiotics and brat diet. Pain is not much on exam.

## 2018-01-02 NOTE — Telephone Encounter (Signed)
noted 

## 2018-01-02 NOTE — Assessment & Plan Note (Signed)
Checking CBC, CMP, lipase in the setting of likely viral GI illness. She is not very tender on exam. No treatment other than tylenol needed at this time.

## 2018-01-02 NOTE — ED Notes (Signed)
Paged Urologist (Dr. Annabell HowellsWrenn) for consult.

## 2018-01-02 NOTE — Telephone Encounter (Signed)
Phoned patient back and left vm that [facility] was open 24  Hours today.

## 2018-01-02 NOTE — Progress Notes (Signed)
   Subjective:    Patient ID: Cheryl Jones, female    DOB: 1961-04-01, 57 y.o.   MRN: [id number]  HPI The patient is a 57 YO female coming in for stomach problems. Started about 6-7 days ago. Called in about 3 days ago with fevers, diarrhea and muscle aches and given home instructions. The diarrhea has not improved so she sought care. She is drinking liquids. Not eating much. Denies blood in the diarrhea. She changed diets about 1 week ago and this diet has a lot more meat. She has having less stools and so took mirlax Tuesday and Wednesday. This caused her to have diarrhea about 4-5 times per day. This was watery in nature. She did then have fevers and chills on Thursday and Friday. She has not taken anything for it. Had some nausea during that time but no vomiting. Still drinking chicken soup and gatorade and water. No solid foods in that time. Now is having about 3 loose stools after breakfast and 1 after lunch daily. She was not sure if this could be related to recent medication change to losartan from olmesartan. She is not having fevers in the last 2-3 days. She is having stomach pain and discomfort in the right lower stomach which is gradually improving. She describes as cramps and 3/10 pain. She has been taking tylenol for the fevers and this has helped some with the pain.  She is also worried about her blood pressure. The olmesartan was not helping her blood pressure. She was changed to losartan 100 mg daily. She is taking that for about 1-2 weeks now. The blood pressure is decreasing at home. She was having daily headaches but today this is improving. Her BP is now 140s-150/60-80s at home. She denies side effects to the losartan.   Review of Systems  Constitutional: Positive for activity change, appetite change, chills and fever. Negative for diaphoresis and fatigue.  HENT: Negative.   Eyes: Negative.   Respiratory: Negative for cough, chest tightness and shortness of breath.   Cardiovascular:  Negative for chest pain, palpitations and leg swelling.  Gastrointestinal: Positive for abdominal distention, abdominal pain, diarrhea and nausea. Negative for anal bleeding, blood in stool, constipation and vomiting.  Musculoskeletal: Positive for myalgias.  Skin: Negative.   Neurological: Negative.   Psychiatric/Behavioral: Negative.       Objective:   Physical Exam  Constitutional: She is oriented to person, place, and time. She appears well-developed and well-nourished.  HENT:  Head: Normocephalic and atraumatic.  Eyes: EOM are normal.  Neck: Normal range of motion.  Cardiovascular: Normal rate and regular rhythm.  Pulmonary/Chest: Effort normal and breath sounds normal. No respiratory distress. She has no wheezes. She has no rales.  Abdominal: Soft. Bowel sounds are normal. She exhibits no distension and no mass. There is tenderness. There is no rebound and no guarding.  Minimal tenderness along the right side worse lower, no guarding or rebound.  Musculoskeletal: She exhibits no edema.  Neurological: She is alert and oriented to person, place, and time. Coordination normal.  Skin: Skin is warm and dry.  Psychiatric: She has a normal mood and affect.   Vitals:   01/02/18 1121  BP: (!) 160/84  Pulse: 87  Temp: 98.7 F (37.1 C)  TempSrc: Oral  SpO2: 99%  Weight: 151 lb 1.9 oz (68.5 kg)  Height: 5\' 1"  (1.549 m)      Assessment & Plan:

## 2018-01-02 NOTE — Assessment & Plan Note (Signed)
She is taking losartan 100 mg daily and needs BMP to evaluate for change in renal function with the change in ARB. BP is mildly high today but more controlled at home. She is advised to maintain dosing. As she has diarrhea it is not appropriate to increase medications today. Recommended follow up with PCP for adjustment if needed. Monitor at home.

## 2018-01-02 NOTE — Telephone Encounter (Signed)
Patient phoned in after checking MyChart and saw the message, go to the ER for Iv fluids today. She will go to [facility] now. Informed her of today's hours.

## 2018-01-02 NOTE — ED Provider Notes (Signed)
[facility] EMERGENCY DEPARTMENT Provider Note   CSN: [id number] Arrival date & time: 01/02/18  1558     History   Chief Complaint Chief Complaint  Patient presents with  . Abdominal Pain    HPI Cheryl Jones is a 57 y.o. female.  57 year old female with past medical history including hypertension, diverticulosis, hiatal hernia who presents with abdominal pain and diarrhea.  Approximately 1 week ago she began having crampy generalized abdominal pain associated with nonbloody diarrhea, body aches, headaches, and fevers.  She initially thought that she had a virus but her diarrhea and stomach cramping has continued.  She has had intermittent fevers for the past 2-3 days, up to 100.8 this morning.  She has been alternating Tylenol and ibuprofen.  She has had nausea and decreased appetite.  She saw her PCP today where they checked labs and sent her here for further evaluation.  She denies any vomiting, urinary symptoms, vaginal bleeding/discharge, sick contacts, or recent travel.   The history is provided by the patient.  Abdominal Pain      Past Medical History:  Diagnosis Date  . DM (diabetes mellitus), gestational   . Hypertension   . Kidney stone   . Plantar fasciitis     Patient Active Problem List   Diagnosis Date Noted  . Diarrhea 01/02/2018  . Elevated hemoglobin (HCC) 05/09/2017  . Hypokalemia 05/09/2017  . Abdominal pain 06/11/2016  . Gastroesophageal reflux disease with esophagitis   . Hiatal hernia   . Asthmatic bronchitis 10/24/2015  . H/O gestational diabetes mellitus, not currently pregnant 08/08/2015  . Proteinuria 05/10/2015  . Urinary tract infectious disease 05/10/2015  . Well adult exam 04/20/2013  . Dizziness 01/11/2012  . Hyperglycemia 01/11/2012  . Hypertension 06/07/2011  . Seromucinous otitis media 06/07/2011  . HIP PAIN 02/23/2011  . ELEVATED BLOOD PRESSURE 02/23/2011  . FASCIITIS, PLANTAR 03/21/2009  . CARPAL TUNNEL SYNDROME  09/11/2007    Past Surgical History:  Procedure Laterality Date  . DILATION AND CURETTAGE OF UTERUS    . ESOPHAGEAL MANOMETRY N/A 03/29/2016   Procedure: ESOPHAGEAL MANOMETRY (EM);  Surgeon: Meryl Dare, MD;  Location: [facility];  Service: Endoscopy;  Laterality: N/A;  . HERNIA REPAIR    . INGUINAL HERNIA REPAIR  2002   right  . WISDOM TOOTH EXTRACTION  01/20/2017    OB History    No data available       Home Medications    Prior to Admission medications   Medication Sig Start Date End Date Taking? Authorizing Provider  Cholecalciferol (VITAMIN D PO) Take 1 tablet by mouth daily.    [provider]  HYDROcodone-acetaminophen (NORCO/VICODIN) 5-325 MG tablet Take 2 tablets by mouth every 4 (four) hours as needed for severe pain. 01/02/18   Madden Garron, Ambrose Finland, MD  loratadine (CLARITIN) 10 MG tablet Take 10 mg by mouth daily.      [provider]  losartan (COZAAR) 100 MG tablet Take 1 tablet (100 mg total) by mouth daily. 12/20/17   Myra Rude, MD  Nutritional Supplements (JUICE PLUS FIBRE PO) Take 2 tablets by mouth daily.    [provider]  omeprazole (PRILOSEC) 40 MG capsule Take 1 capsule (40 mg total) by mouth daily. 05/26/17   Meryl Dare, MD  ondansetron (ZOFRAN) 4 MG tablet Take 1 tablet (4 mg total) by mouth every 8 (eight) hours as needed for nausea or vomiting. 01/02/18   Myrlene Broker, MD  Probiotic Product (PROBIOTIC DAILY PO)  Take by mouth daily.    [provider]  TURMERIC PO Take 1 tablet by mouth daily.    [provider]    Family History Family History  Problem Relation Age of Onset  . Colon cancer Paternal Uncle [id number]  . Hyperlipidemia Mother   . Hypertension Mother   . Hyperlipidemia Father   . Hypertension Father   . Hypertension Other   . Coronary artery disease Neg Hx   . Esophageal cancer Neg Hx   . Rectal cancer Neg Hx   . Stomach cancer Neg Hx     Social History Social  History   Tobacco Use  . Smoking status: Never Smoker  . Smokeless tobacco: Never Used  Substance Use Topics  . Alcohol use: Yes    Alcohol/week: 1.8 oz    Types: 3 Glasses of wine per week  . Drug use: No     Allergies   Patient has no known allergies.   Review of Systems Review of Systems  Gastrointestinal: Positive for abdominal pain.   All other systems reviewed and are negative except that which was mentioned in HPI   Physical Exam Updated Vital Signs BP 115/72   Pulse 88   Temp 98.6 F (37 C) (Oral)   Resp 16   Ht 5\' 1"  (1.549 m)   Wt 68.5 kg (151 lb)   SpO2 96%   BMI 28.53 kg/m   Physical Exam  Constitutional: She is oriented to person, place, and time. She appears well-developed and well-nourished. No distress.  HENT:  Head: Normocephalic and atraumatic.  dry mucous membranes  Eyes: Conjunctivae are normal. Pupils are equal, round, and reactive to light.  Neck: Neck supple.  Cardiovascular: Normal rate, regular rhythm and normal heart sounds.  No murmur heard. Pulmonary/Chest: Effort normal and breath sounds normal.  Abdominal: Soft. She exhibits no distension. Bowel sounds are increased. There is tenderness. There is no rebound and no guarding.  Mild RUQ, LLQ tenderness  Musculoskeletal: She exhibits no edema.  Neurological: She is alert and oriented to person, place, and time.  Fluent speech  Skin: Skin is warm and dry.  Psychiatric: She has a normal mood and affect. Judgment normal.  Nursing note and vitals reviewed.    ED Treatments / Results  Labs (all labs ordered are listed, but only abnormal results are displayed) Labs Reviewed  COMPREHENSIVE METABOLIC PANEL - Abnormal; Notable for the following components:      Result Value   CO2 21 (*)    Glucose, Bld 109 (*)    BUN 32 (*)    Creatinine, Ser 1.55 (*)    Calcium 7.7 (*)    Total Protein 6.0 (*)    Albumin 2.4 (*)    GFR calc non Af Amer 37 (*)    GFR calc Af Amer 42 (*)    All  other components within normal limits    EKG  EKG Interpretation None       Radiology Ct Abdomen Pelvis Wo Contrast  Result Date: 01/02/2018 CLINICAL DATA:  Right flank pain x1 week EXAM: CT ABDOMEN AND PELVIS WITHOUT CONTRAST TECHNIQUE: Multidetector CT imaging of the abdomen and pelvis was performed following the standard protocol without IV contrast. COMPARISON:  02/18/2016 FINDINGS: Lower chest: Redemonstration of large hiatal hernia. The previous herniation of portions of the colon, pancreas and portions of the splenic vein are no longer present. Hepatobiliary: No focal liver abnormality is seen given limitations of a noncontrast exam. No gallstones, gallbladder  wall thickening, or biliary dilatation. Pancreas: Unremarkable. No pancreatic ductal dilatation or surrounding inflammatory changes. Spleen: Normal in size without focal abnormality. Adrenals/Urinary Tract: Punctate bilateral renal calculi. Asymmetric enlargement of the right kidney with perinephric fat stranding and mild-to-moderate hydroureteronephrosis is identified. There is a 7 mm distal right ureteral calculus approximately 1.5 cm from the right the ureterovesical juncture likely the cause of the patient's hydroureteronephrosis. Stomach/Bowel: Large hiatal hernia. Normal small bowel rotation. Sigmoid diverticulosis without acute diverticulitis. Vascular/Lymphatic: Normal caliber aorta.  No adenopathy. Reproductive: Uterus and bilateral adnexa are unremarkable. Other: No abdominal wall hernia or abnormality. No abdominopelvic ascites. Musculoskeletal: No acute or significant osseous findings. Left lower quadrant hernia repair with surgical tacks in place. IMPRESSION: 1. 7 mm distal right ureteral calculus approximately 1.5 cm from the UVJ causing mild-to-moderate right-sided hydroureteronephrosis. 2. Bilateral punctate nonobstructing renal calculi. 3. Large hiatal hernia containing most of the stomach. 4. Colonic diverticulosis without  acute diverticulitis. Electronically Signed   By: Tollie Eth M.D.   On: 01/02/2018 19:38    Procedures Procedures (including critical care time)  Medications Ordered in ED Medications  sodium chloride 0.9 % bolus 2,000 mL (0 mLs Intravenous Stopped 01/02/18 2035)     Initial Impression / Assessment and Plan / ED Course  I have reviewed the triage vital signs and the nursing notes.  Pertinent labs & imaging results that were available during my care of the patient were reviewed by me and considered in my medical decision making (see chart for details).     Diarrhea, crampy abd pain, and recent fevers. Well appearing and w/ reassuring VS on exam. Mild tenderness but no distension or peritonitis. Labs from earlier today show WBC 14,000, Cr 1.77. Concern for diverticulitis, therefore obtained CT abd/pelvis and gave 2L IVF.  CT shows 7 mm distal right ureteral calculus 1.5 cm from the UVJ with mild to moderate right-sided hydronephrosis.  Diverticulosis noted without diverticulitis.  Given that stone is right-sided and this is the side of her pain, I suspect her symptoms are related to the stone.  Discussed with urologist, Dr. Annabell Howells, who commended follow-up in the clinic this week given that she is tolerating PO here, afebrile, UA without evidence of infection, and pain adequately controlled. She is comfortable and drinking fluids in ED and feels comfortable with outpatient management. Repeat CMP after fluids shows improvement of Cr to 1.55.  Provided with [facility] follow-up information as well as instructed to see PCP in a few days for recheck of creatinine and stool studies if diarrhea is persistent.  Extensively reviewed return precautions and instructed to report to [facility] ED if she has any fever, intractable pain, intractable vomiting.  She voiced understanding and was discharged in satisfactory condition. Final Clinical Impressions(s) / ED Diagnoses   Final diagnoses:  Right  ureteral stone  AKI (acute kidney injury) (HCC)  Diarrhea, unspecified type    ED Discharge Orders        Ordered    HYDROcodone-acetaminophen (NORCO/VICODIN) 5-325 MG tablet  Every 4 hours PRN     01/02/18 2300       Tytan Sandate, Ambrose Finland, MD 01/02/18 2323

## 2018-01-02 NOTE — ED Triage Notes (Signed)
C/o abd pain, diarrhea x 1 week-states she was seen by PCP today-sent to ED-pt NAD-steady gait

## 2018-01-02 NOTE — ED Notes (Signed)
ED Provider at bedside. 

## 2018-01-02 NOTE — Discharge Instructions (Signed)
You may take 1-2 vicodin tablets every 4-6 hours as needed for severe pain. Follow up with [facility] this week. Follow up with your primary care provider this week for recheck of kidney function and stool studies if diarrhea persists. [facility] if you have fever, worsening pain, vomiting, or new symptoms.

## 2018-01-02 NOTE — ED Notes (Signed)
Pt has finished her fluids.

## 2018-01-03 ENCOUNTER — Telehealth: Payer: Self-pay | Admitting: Internal Medicine

## 2018-01-03 DIAGNOSIS — N201 Calculus of ureter: Secondary | ICD-10-CM | POA: Diagnosis not present

## 2018-01-03 NOTE — Telephone Encounter (Signed)
I agree with Dr. Frutoso Chaserawford's plan.  Thank you

## 2018-01-03 NOTE — Telephone Encounter (Signed)
Pt came by office today stating she has called the [facility] earlier to let them know her diarrhea continues today and she has been scheduled for surgery to remove the kidney stone on Thursday of this week.  Pt was told they were in clinic & someone would follow up with her.  Pt came in because she still had not heard back.  No CRM or telephone note documented regarding this communication.  I spoke with Dr. Okey Duprerawford & she stated the pt should do a stool sample by the end of the week or first of next week if diarrhea doesn't resolve itself. Relayed this information to the pt.

## 2018-01-05 DIAGNOSIS — N390 Urinary tract infection, site not specified: Secondary | ICD-10-CM | POA: Diagnosis not present

## 2018-01-05 DIAGNOSIS — N201 Calculus of ureter: Secondary | ICD-10-CM | POA: Diagnosis not present

## 2018-01-10 ENCOUNTER — Ambulatory Visit: Payer: Self-pay | Admitting: *Deleted

## 2018-01-10 NOTE — Telephone Encounter (Signed)
Patient is calling to let provider know that after she was seen in the office- she went to the hospital and was diagnosed with a kidney stone and infection in the ureter.Patient is on antibiotic now and she is to be rescheduled for lithotripsy.She is concerned because she has developed a cough today. She has no other symptoms. Advised home care for now and she is to call back if she does not get better with OTC treatment and time.  Reason for Disposition . Cough  Answer Assessment - Initial Assessment Questions 1. ONSET: "When did the cough begin?"      today 2. SEVERITY: "How bad is the cough today?"      More concerning than bothersome 3. RESPIRATORY DISTRESS: "Describe your breathing."      Patient is breathing fine 4. FEVER: "Do you have a fever?" If so, ask: "What is your temperature, how was it measured, and when did it start?"     no 5. HEMOPTYSIS: "Are you coughing up any blood?" If so ask: "How much?" (flecks, streaks, tablespoons, etc.)     no 6. TREATMENT: "What have you done so far to treat the cough?" (e.g., meds, fluids, humidifier)     Pushing fliuds 7. CARDIAC HISTORY: "Do you have any history of heart disease?" (e.g., heart attack, congestive heart failure)      no 8. LUNG HISTORY: "Do you have any history of lung disease?"  (e.g., pulmonary embolus, asthma, emphysema)     no 9. PE RISK FACTORS: "Do you have a history of blood clots?" (or: recent major surgery, recent prolonged travel, bedridden )     No- patient did have procedure on Thurday 10. OTHER SYMPTOMS: "Do you have any other symptoms? (e.g., runny nose, wheezing, chest pain)       no 11. PREGNANCY: "Is there any chance you are pregnant?" "When was your last menstrual period?"       Tapering off- UPT neg on Thursday 12. TRAVEL: "Have you traveled out of the country in the last month?" (e.g., travel history, exposures)       no  Protocols used: COUGH - ACUTE NON-PRODUCTIVE-A-AH

## 2018-01-17 DIAGNOSIS — N201 Calculus of ureter: Secondary | ICD-10-CM | POA: Diagnosis not present

## 2018-01-17 NOTE — Telephone Encounter (Signed)
Can patient be worked in sooner than 2/8?

## 2018-01-17 NOTE — Telephone Encounter (Signed)
LVM for patient, MD cannot work her in sooner, she can get in sooner if she would like to see someone else,. Was told to call back if she would like to see someone else,

## 2018-01-17 NOTE — Telephone Encounter (Signed)
Copied from CRM [phone]. Topic: Appointment Scheduling - Scheduling Inquiry for Clinic >> Jan 17, 2018  3:17 PM Raquel SarnaHayes, Teresa G wrote: Pt would like to be worked into Dr. Posey ReaPlotnikov and Dr. Okey Duprerawford before her Friday visit w/ Dr. Demetrius CharityP.  She has had the symptoms for 6 or 7 days is why she wasn't scheduled in a same day slot. She has a surgery on 2/13 and needs to be seen sooner to make sure she is well in time.

## 2018-01-18 ENCOUNTER — Other Ambulatory Visit (INDEPENDENT_AMBULATORY_CARE_PROVIDER_SITE_OTHER): Payer: BLUE CROSS/BLUE SHIELD

## 2018-01-18 ENCOUNTER — Encounter: Payer: Self-pay | Admitting: Family

## 2018-01-18 ENCOUNTER — Ambulatory Visit: Payer: BLUE CROSS/BLUE SHIELD | Admitting: Family

## 2018-01-18 DIAGNOSIS — J019 Acute sinusitis, unspecified: Secondary | ICD-10-CM | POA: Diagnosis not present

## 2018-01-18 DIAGNOSIS — R899 Unspecified abnormal finding in specimens from other organs, systems and tissues: Secondary | ICD-10-CM | POA: Diagnosis not present

## 2018-01-18 DIAGNOSIS — J209 Acute bronchitis, unspecified: Secondary | ICD-10-CM | POA: Diagnosis not present

## 2018-01-18 LAB — COMPREHENSIVE METABOLIC PANEL
ALBUMIN: 4 g/dL (ref 3.5–5.2)
ALK PHOS: 100 U/L (ref 39–117)
ALT: 11 U/L (ref 0–35)
AST: 12 U/L (ref 0–37)
BUN: 16 mg/dL (ref 6–23)
CHLORIDE: 102 meq/L (ref 96–112)
CO2: 28 mEq/L (ref 19–32)
Calcium: 9.6 mg/dL (ref 8.4–10.5)
Creatinine, Ser: 1.31 mg/dL — ABNORMAL HIGH (ref 0.40–1.20)
GFR: 44.65 mL/min — AB (ref >60.00)
Glucose, Bld: 120 mg/dL — ABNORMAL HIGH (ref 70–99)
POTASSIUM: 4 meq/L (ref 3.5–5.1)
SODIUM: 137 meq/L (ref 135–145)
TOTAL PROTEIN: 7.7 g/dL (ref 6.0–8.3)
Total Bilirubin: 0.5 mg/dL (ref 0.2–1.2)

## 2018-01-18 LAB — CBC WITH DIFFERENTIAL/PLATELET
BASOS PCT: 1.2 % (ref 0.0–3.0)
Basophils Absolute: 0.1 10*3/uL (ref 0.0–0.1)
Eosinophils Absolute: 0.3 10*3/uL (ref 0.0–0.7)
Eosinophils Relative: 3.2 % (ref 0.0–5.0)
HCT: 39.3 % (ref 36.0–46.0)
HEMOGLOBIN: 13.2 g/dL (ref 12.0–15.0)
LYMPHS ABS: 3 10*3/uL (ref 0.7–4.0)
Lymphocytes Relative: 36.9 % (ref 12.0–46.0)
MCHC: 33.6 g/dL (ref 30.0–36.0)
MCV: 90.4 fl (ref 78.0–100.0)
MONO ABS: 0.3 10*3/uL (ref 0.1–1.0)
MONOS PCT: 4 % (ref 3.0–12.0)
NEUTROS PCT: 54.7 % (ref 43.0–77.0)
Neutro Abs: 4.5 10*3/uL (ref 1.4–7.7)
Platelets: 393 10*3/uL (ref 150.0–400.0)
RBC: 4.34 Mil/uL (ref 3.87–5.11)
RDW: 13.4 % (ref 11.5–15.5)
WBC: 8.2 10*3/uL (ref 4.0–10.5)

## 2018-01-18 MED ORDER — LEVOFLOXACIN 750 MG PO TABS: 750.0000 mg | ORAL_TABLET | Freq: Every day | ORAL | 0 refills | Status: DC

## 2018-01-18 MED ORDER — ALBUTEROL SULFATE HFA 108 (90 BASE) MCG/ACT IN AERS: 2.0000 | INHALATION_SPRAY | Freq: Four times a day (QID) | RESPIRATORY_TRACT | 0 refills | Status: DC | PRN

## 2018-01-18 MED ORDER — FLUTICASONE PROPIONATE 50 MCG/ACT NA SUSP: 2.0000 | Freq: Every day | NASAL | 6 refills | Status: DC

## 2018-01-18 NOTE — Progress Notes (Signed)
Cheryl Jones is a 57 y.o. female with the following history as recorded in Epic[employer]:  Patient Active Problem List   Diagnosis Date Noted  . Diarrhea 01/02/2018  . Elevated hemoglobin (HCC) 05/09/2017  . Hypokalemia 05/09/2017  . Abdominal pain 06/11/2016  . Gastroesophageal reflux disease with esophagitis   . Hiatal hernia   . Asthmatic bronchitis 10/24/2015  . H/O gestational diabetes mellitus, not currently pregnant 08/08/2015  . Proteinuria 05/10/2015  . Urinary tract infectious disease 05/10/2015  . Well adult exam 04/20/2013  . Dizziness 01/11/2012  . Hyperglycemia 01/11/2012  . Hypertension 06/07/2011  . Seromucinous otitis media 06/07/2011  . HIP PAIN 02/23/2011  . ELEVATED BLOOD PRESSURE 02/23/2011  . FASCIITIS, PLANTAR 03/21/2009  . CARPAL TUNNEL SYNDROME 09/11/2007    Current Outpatient Medications  Medication Sig Dispense Refill  . Cholecalciferol (VITAMIN D PO) Take 1 tablet by mouth daily.    Marland Kitchen. losartan (COZAAR) 100 MG tablet Take 1 tablet (100 mg total) by mouth daily. 90 tablet 0  . Nutritional Supplements (JUICE PLUS FIBRE PO) Take 2 tablets by mouth daily.    Marland Kitchen. omeprazole (PRILOSEC) 40 MG capsule Take 1 capsule (40 mg total) by mouth daily. 90 capsule 3  . Probiotic Product (PROBIOTIC DAILY PO) Take by mouth daily.    . TURMERIC PO Take 1 tablet by mouth daily.    Marland Kitchen. albuterol (PROVENTIL HFA;VENTOLIN HFA) 108 (90 Base) MCG/ACT inhaler Inhale 2 puffs into the lungs every 6 (six) hours as needed for wheezing or shortness of breath. 1 Inhaler 0  . fluticasone (FLONASE) 50 MCG/ACT nasal spray Place 2 sprays into both nostrils daily. 16 g 6  . levofloxacin (LEVAQUIN) 750 MG tablet Take 1 tablet (750 mg total) by mouth daily. 5 tablet 0  . loratadine (CLARITIN) 10 MG tablet Take 10 mg by mouth daily.       No current facility-administered medications for this visit.     Allergies: Patient has no known allergies.  Past Medical History:  Diagnosis Date  . DM  (diabetes mellitus), gestational   . Hypertension   . Kidney stone   . Plantar fasciitis     Past Surgical History:  Procedure Laterality Date  . DILATION AND CURETTAGE OF UTERUS    . ESOPHAGEAL MANOMETRY N/A 03/29/2016   Procedure: ESOPHAGEAL MANOMETRY (EM);  Surgeon: Meryl DareMalcolm T Stark, MD;  Location: [facility];  Service: Endoscopy;  Laterality: N/A;  . HERNIA REPAIR    . INGUINAL HERNIA REPAIR  2002   right  . WISDOM TOOTH EXTRACTION  01/20/2017    Family History  Problem Relation Age of Onset  . Colon cancer Paternal Uncle 3943  . Hyperlipidemia Mother   . Hypertension Mother   . Hyperlipidemia Father   . Hypertension Father   . Hypertension Other   . Coronary artery disease Neg Hx   . Esophageal cancer Neg Hx   . Rectal cancer Neg Hx   . Stomach cancer Neg Hx     Social History   Tobacco Use  . Smoking status: Never Smoker  . Smokeless tobacco: Never Used  Substance Use Topics  . Alcohol use: Yes    Alcohol/week: 1.8 oz    Types: 3 Glasses of wine per week    Subjective:  Patient is scheduled for lithotripsy on 2/13 ( right sided kidney stone);  Currently has stent in place until able to get stone removed and then stent will be replaced; was put on 10 days of Cipro ( finished 01/14/18)  to cover for infection until surgery can be completed; In the past 2 weeks, patient also got a "head cold" with Delsym and Sudafed; + fatigue; + ears feel full; + thick mucus; + persisting drainage; concerned that cold is progressing into her chest; "just can't seem to shake it." Wants to do everything to be 100% for upcoming surgery;  Of note, has only been taking her Losartan every other day until surgery is completed; feels this may be why her blood pressure is mildly elevated.     Objective:  Vitals:   01/18/18 1148  BP: (!) 142/80  Pulse: 82  Temp: 98.1 F (36.7 C)  TempSrc: Oral  SpO2: 98%  Weight: 147 lb (66.7 kg)  Height: 5\' 1"  (1.549 m)    General: Well developed, well  nourished, in no acute distress  Skin : Warm and dry.  Head: Normocephalic and atraumatic  Eyes: Sclera and conjunctiva clear; pupils round and reactive to light; extraocular movements intact  Ears: External normal; canals clear; tympanic membranes congested bilaterally  Oropharynx: Pink, supple. No suspicious lesions  Neck: Supple without thyromegaly, adenopathy  Lungs: Respirations unlabored; wheezing in upper lobes CVS exam: normal rate and regular rhythm.  Neurologic: Alert and oriented; speech intact; face symmetrical; moves all extremities well; CNII-XII intact without focal deficit  Assessment:  1. Acute bronchitis, unspecified organism   2. Acute sinusitis, recurrence not specified, unspecified location   3. Abnormal laboratory test     Plan:  1. & 2. With upcoming surgery on 2/13, will treat with Levaquin 750 mg qd x 5 days; Rx for Flonase and albuterol; increase fluids, rest and follow up worse, no better. 3. Check CBC, CMP today as well.    No Follow-up on file.  Orders Placed This Encounter  Procedures  . Comprehensive metabolic panel    Standing Status:   Future    Number of Occurrences:   1    Standing Expiration Date:   01/18/2019  . CBC w/Diff    Standing Status:   Future    Number of Occurrences:   1    Standing Expiration Date:   01/18/2019    Requested Prescriptions   Signed Prescriptions Disp Refills  . fluticasone (FLONASE) 50 MCG/ACT nasal spray 16 g 6    Sig: Place 2 sprays into both nostrils daily.  Marland Kitchen albuterol (PROVENTIL HFA;VENTOLIN HFA) 108 (90 Base) MCG/ACT inhaler 1 Inhaler 0    Sig: Inhale 2 puffs into the lungs every 6 (six) hours as needed for wheezing or shortness of breath.  . levofloxacin (LEVAQUIN) 750 MG tablet 5 tablet 0    Sig: Take 1 tablet (750 mg total) by mouth daily.

## 2018-01-20 ENCOUNTER — Ambulatory Visit: Payer: BLUE CROSS/BLUE SHIELD | Admitting: Internal Medicine

## 2018-01-25 DIAGNOSIS — N201 Calculus of ureter: Secondary | ICD-10-CM | POA: Diagnosis not present

## 2018-02-06 ENCOUNTER — Telehealth: Payer: Self-pay | Admitting: Gastroenterology

## 2018-02-06 DIAGNOSIS — N201 Calculus of ureter: Secondary | ICD-10-CM | POA: Diagnosis not present

## 2018-02-07 NOTE — Telephone Encounter (Signed)
ok 

## 2018-02-07 NOTE — Telephone Encounter (Signed)
Dr Rhea BeltonPyrtle is this ok?

## 2018-02-07 NOTE — Telephone Encounter (Signed)
Cheryl QuinLinda Dr Rhea BeltonPyrtle has accepted this pt as a transfer from Dr Russella DarStark

## 2018-02-07 NOTE — Telephone Encounter (Signed)
See notes below

## 2018-02-07 NOTE — Telephone Encounter (Signed)
OK 

## 2018-02-07 NOTE — Telephone Encounter (Signed)
Dr Stark is this ok? 

## 2018-02-08 NOTE — Telephone Encounter (Signed)
Patient has been notified of this and has been scheduled for an office visit with Dr.[name].

## 2018-02-14 DIAGNOSIS — Z1231 Encounter for screening mammogram for malignant neoplasm of breast: Secondary | ICD-10-CM | POA: Diagnosis not present

## 2018-02-14 DIAGNOSIS — Z1151 Encounter for screening for human papillomavirus (HPV): Secondary | ICD-10-CM | POA: Diagnosis not present

## 2018-02-14 DIAGNOSIS — R300 Dysuria: Secondary | ICD-10-CM | POA: Diagnosis not present

## 2018-02-14 DIAGNOSIS — Z6828 Body mass index (BMI) 28.0-28.9, adult: Secondary | ICD-10-CM | POA: Diagnosis not present

## 2018-02-14 DIAGNOSIS — Z01419 Encounter for gynecological examination (general) (routine) without abnormal findings: Secondary | ICD-10-CM | POA: Diagnosis not present

## 2018-03-08 DIAGNOSIS — N200 Calculus of kidney: Secondary | ICD-10-CM | POA: Diagnosis not present

## 2018-03-08 DIAGNOSIS — N132 Hydronephrosis with renal and ureteral calculous obstruction: Secondary | ICD-10-CM | POA: Diagnosis not present

## 2018-03-08 DIAGNOSIS — R8271 Bacteriuria: Secondary | ICD-10-CM | POA: Diagnosis not present

## 2018-03-08 DIAGNOSIS — N130 Hydronephrosis with ureteropelvic junction obstruction: Secondary | ICD-10-CM | POA: Diagnosis not present

## 2018-03-14 ENCOUNTER — Encounter (HOSPITAL_COMMUNITY): Payer: Self-pay | Admitting: Emergency Medicine

## 2018-03-14 ENCOUNTER — Emergency Department (HOSPITAL_COMMUNITY)
Admission: EM | Admit: 2018-03-14 | Discharge: 2018-03-14 | Disposition: A | Discharge status: Home / Self Care | Payer: BLUE CROSS/BLUE SHIELD | Attending: Emergency Medicine | Admitting: Emergency Medicine

## 2018-03-14 ENCOUNTER — Ambulatory Visit: Payer: Self-pay

## 2018-03-14 DIAGNOSIS — Z79899 Other long term (current) drug therapy: Secondary | ICD-10-CM | POA: Insufficient documentation

## 2018-03-14 DIAGNOSIS — I10 Essential (primary) hypertension: Secondary | ICD-10-CM | POA: Diagnosis not present

## 2018-03-14 LAB — BASIC METABOLIC PANEL
Anion gap: 9 (ref 5–15)
BUN: 21 mg/dL — ABNORMAL HIGH (ref 6–20)
CO2: 26 mmol/L (ref 22–32)
Calcium: 9.4 mg/dL (ref 8.9–10.3)
Chloride: 107 mmol/L (ref 101–111)
Creatinine, Ser: 1.07 mg/dL — ABNORMAL HIGH (ref 0.44–1.00)
GFR calc Af Amer: >60 mL/min (ref >60)
GFR calc non Af Amer: 57 mL/min — ABNORMAL LOW (ref >60)
Glucose, Bld: 91 mg/dL (ref 65–99)
Potassium: 4 mmol/L (ref 3.5–5.1)
Sodium: 142 mmol/L (ref 135–145)

## 2018-03-14 MED ORDER — AMLODIPINE BESYLATE 5 MG PO TABS: 5.0000 mg | ORAL_TABLET | Freq: Every day | ORAL | 0 refills | Status: DC

## 2018-03-14 NOTE — ED Provider Notes (Signed)
[facility]-EMERGENCY DEPT Provider Note   CSN: [id number] Arrival date & time: 03/14/18  1127     History   Chief Complaint Chief Complaint  Patient presents with  . Hypertension    HPI Cheryl Jones is a 57 y.o. female.  HPI  57yF with hypertension. Has been keeping log at home and BP consistently 150+ systolic and occasionally as high as 200. Hx of HTN on ARB. Has been taking intermittently with recent renal impairment/urologic issues but restarted and has been taking regularly for almost a week now. No CP, dyspnea, urinary problems or swelling. Denies HA but eyes feel "swollen and heavy like I have bad allergies." No blurred vision.   Past Medical History:  Diagnosis Date  . DM (diabetes mellitus), gestational   . Hypertension   . Kidney stone   . Plantar fasciitis     Patient Active Problem List   Diagnosis Date Noted  . Diarrhea 01/02/2018  . Elevated hemoglobin (HCC) 05/09/2017  . Hypokalemia 05/09/2017  . Abdominal pain 06/11/2016  . Gastroesophageal reflux disease with esophagitis   . Hiatal hernia   . Asthmatic bronchitis 10/24/2015  . H/O gestational diabetes mellitus, not currently pregnant 08/08/2015  . Proteinuria 05/10/2015  . Urinary tract infectious disease 05/10/2015  . Well adult exam 04/20/2013  . Dizziness 01/11/2012  . Hyperglycemia 01/11/2012  . Hypertension 06/07/2011  . Seromucinous otitis media 06/07/2011  . HIP PAIN 02/23/2011  . ELEVATED BLOOD PRESSURE 02/23/2011  . FASCIITIS, PLANTAR 03/21/2009  . CARPAL TUNNEL SYNDROME 09/11/2007    Past Surgical History:  Procedure Laterality Date  . DILATION AND CURETTAGE OF UTERUS    . ESOPHAGEAL MANOMETRY N/A 03/29/2016   Procedure: ESOPHAGEAL MANOMETRY (EM);  Surgeon: Meryl Dare, MD;  Location: [facility];  Service: Endoscopy;  Laterality: N/A;  . HERNIA REPAIR    . INGUINAL HERNIA REPAIR  2002   right  . WISDOM TOOTH EXTRACTION  01/20/2017     OB History     None      Home Medications    Prior to Admission medications   Medication Sig Start Date End Date Taking? Authorizing Provider  albuterol (PROVENTIL HFA;VENTOLIN HFA) 108 (90 Base) MCG/ACT inhaler Inhale 2 puffs into the lungs every 6 (six) hours as needed for wheezing or shortness of breath. 01/18/18   Olive Bass, FNP  Cholecalciferol (VITAMIN D PO) Take 1 tablet by mouth daily.    [provider]  fluticasone (FLONASE) 50 MCG/ACT nasal spray Place 2 sprays into both nostrils daily. 01/18/18   Olive Bass, FNP  levofloxacin (LEVAQUIN) 750 MG tablet Take 1 tablet (750 mg total) by mouth daily. 01/18/18   Olive Bass, FNP  loratadine (CLARITIN) 10 MG tablet Take 10 mg by mouth daily.      [provider]  losartan (COZAAR) 100 MG tablet Take 1 tablet (100 mg total) by mouth daily. 12/20/17   Myra Rude, MD  Nutritional Supplements (JUICE PLUS FIBRE PO) Take 2 tablets by mouth daily.    [provider]  omeprazole (PRILOSEC) 40 MG capsule Take 1 capsule (40 mg total) by mouth daily. 05/26/17   Meryl Dare, MD  Probiotic Product (PROBIOTIC DAILY PO) Take by mouth daily.    [provider]  TURMERIC PO Take 1 tablet by mouth daily.    [provider]    Family History Family History  Problem Relation Age of Onset  . Colon cancer Paternal Uncle 18  .  Hyperlipidemia Mother   . Hypertension Mother   . Hyperlipidemia Father   . Hypertension Father   . Hypertension Other   . Coronary artery disease Neg Hx   . Esophageal cancer Neg Hx   . Rectal cancer Neg Hx   . Stomach cancer Neg Hx     Social History Social History   Tobacco Use  . Smoking status: Never Smoker  . Smokeless tobacco: Never Used  Substance Use Topics  . Alcohol use: Yes    Alcohol/week: 1.8 oz    Types: 3 Glasses of wine per week  . Drug use: No     Allergies   Patient has no known allergies.   Review of Systems Review of  Systems  All systems reviewed and negative, other than as noted in HPI.  Physical Exam Updated Vital Signs There were no vitals taken for this visit.  Physical Exam  Constitutional: She appears well-developed and well-nourished. No distress.  HENT:  Head: Normocephalic and atraumatic.  Eyes: Conjunctivae are normal. Right eye exhibits no discharge. Left eye exhibits no discharge.  Neck: Neck supple.  Cardiovascular: Normal rate, regular rhythm and normal heart sounds. Exam reveals no gallop and no friction rub.  No murmur heard. Pulmonary/Chest: Effort normal and breath sounds normal. No respiratory distress.  Abdominal: Soft. She exhibits no distension. There is no tenderness.  Musculoskeletal: She exhibits no edema or tenderness.  Neurological: She is alert.  Skin: Skin is warm and dry.  Psychiatric: She has a normal mood and affect. Her behavior is normal. Thought content normal.  Nursing note and vitals reviewed.    ED Treatments / Results  Labs (all labs ordered are listed, but only abnormal results are displayed) Labs Reviewed  BASIC METABOLIC PANEL - Abnormal; Notable for the following components:      Result Value   BUN 21 (*)    Creatinine, Ser 1.07 (*)    GFR calc non Af Amer 57 (*)    All other components within normal limits    EKG None  Radiology No results found.  Procedures Procedures (including critical care time)  Medications Ordered in ED Medications - No data to display   Initial Impression / Assessment and Plan / ED Course  I have reviewed the triage vital signs and the nursing notes.  Pertinent labs & imaging results that were available during my care of the patient were reviewed by me and considered in my medical decision making (see chart for details).     57yF with HTN. Hx of the same. Little in terms of symptoms. Discussed options. Will start on low dose Norvasc until can follow-up with PCP. Advised to continue keeping log of BP and  take with her to her next visit   Final Clinical Impressions(s) / ED Diagnoses   Final diagnoses:  Hypertension, unspecified type    ED Discharge Orders    None       Raeford RazorKohut, Maximus Hoffert, MD 03/15/18 1324

## 2018-03-14 NOTE — Telephone Encounter (Signed)
Pt calling with very high BP: 196/109 and 148/119 pt has a headache but no C/P or SOB on weakness or numbness on either side of the body. No blurred vision or spots before eyes. Pt advised to go to the ED. Reason for Disposition . [1] Systolic BP  >= 160 OR Diastolic >= 100 AND [2] cardiac or neurologic symptoms (e.g., chest pain, difficulty breathing, unsteady gait, blurred vision)  Answer Assessment - Initial Assessment Questions 1. BLOOD PRESSURE: "What is the blood pressure?" "Did you take at least two measurements 5 minutes apart?"     196/109 and 148/119 2. ONSET: "When did you take your blood pressure?"  1000 and 1030 3. HOW: "How did you obtain the blood pressure?" (e.g., visiting nurse, automatic home BP monitor) Auto home 4. HISTORY: "Do you have a history of high blood pressure?"     yes 5. MEDICATIONS: "Are you taking any medications for blood pressure?" "Have you missed any doses recently?"     Yes-no 6. OTHER SYMPTOMS: "Do you have any symptoms?" (e.g., headache, chest pain, blurred vision, difficulty breathing, weakness)     headache 7. PREGNANCY: "Is there any chance you are pregnant?" "When was your last menstrual period?" no  Protocols used: HIGH BLOOD PRESSURE-A-AH

## 2018-03-14 NOTE — ED Notes (Signed)
Bed: WA10 Expected date:  Expected time:  Means of arrival:  Comments: Triage 1   

## 2018-03-14 NOTE — Telephone Encounter (Signed)
Patient went to ED

## 2018-03-14 NOTE — ED Triage Notes (Signed)
Pt reports that her BP hasnt been controlled with her current HTN medications. Reports that she wasn't taking it daily as bad for kideys. But since last Wed has been taking daily and still having high BP. When called her PCP today was instructed to come to ED.

## 2018-03-15 ENCOUNTER — Encounter: Payer: Self-pay | Admitting: Internal Medicine

## 2018-03-15 ENCOUNTER — Encounter: Payer: Self-pay | Admitting: Family

## 2018-03-15 ENCOUNTER — Ambulatory Visit: Payer: BLUE CROSS/BLUE SHIELD | Admitting: Family

## 2018-03-15 ENCOUNTER — Ambulatory Visit: Payer: Self-pay

## 2018-03-15 VITALS — BP 180/110 | HR 94 | Temp 98.2°F | Ht 61.0 in | Wt 152.0 lb

## 2018-03-15 DIAGNOSIS — I10 Essential (primary) hypertension: Secondary | ICD-10-CM | POA: Diagnosis not present

## 2018-03-15 NOTE — Progress Notes (Signed)
Cheryl Jones is a 57 y.o. female with the following history as recorded in EpicCare:  Patient Active Problem List   Diagnosis Date Noted  . Diarrhea 01/02/2018  . Elevated hemoglobin (HCC) 05/09/2017  . Hypokalemia 05/09/2017  . Abdominal pain 06/11/2016  . Gastroesophageal reflux disease with esophagitis   . Hiatal hernia   . Asthmatic bronchitis 10/24/2015  . H/O gestational diabetes mellitus, not currently pregnant 08/08/2015  . Proteinuria 05/10/2015  . Urinary tract infectious disease 05/10/2015  . Well adult exam 04/20/2013  . Dizziness 01/11/2012  . Hyperglycemia 01/11/2012  . Hypertension 06/07/2011  . Seromucinous otitis media 06/07/2011  . HIP PAIN 02/23/2011  . ELEVATED BLOOD PRESSURE 02/23/2011  . FASCIITIS, PLANTAR 03/21/2009  . CARPAL TUNNEL SYNDROME 09/11/2007    Current Outpatient Medications  Medication Sig Dispense Refill  . amLODipine (NORVASC) 5 MG tablet Take 1 tablet (5 mg total) by mouth daily. 30 tablet 0  . calcium-vitamin D 250-100 MG-UNIT tablet Take 1 tablet by mouth daily.    . Cholecalciferol (VITAMIN D PO) Take 1 tablet by mouth daily.    Marland Kitchen. ibuprofen (ADVIL,MOTRIN) 200 MG tablet Take 400 mg by mouth 2 (two) times daily. FOR 6 WEEKS FOR KIDNEY/BLADDER INFLAMMATION    . loratadine (CLARITIN) 10 MG tablet Take 10 mg by mouth daily.      Marland Kitchen. losartan (COZAAR) 100 MG tablet Take 1 tablet (100 mg total) by mouth daily. 90 tablet 0  . MAGNESIUM PO Take 150 mg by mouth daily.    . Nutritional Supplements (JUICE PLUS FIBRE PO) Take 2 tablets by mouth daily.    Marland Kitchen. omeprazole (PRILOSEC) 40 MG capsule Take 1 capsule (40 mg total) by mouth daily. 90 capsule 3  . Probiotic Product (PROBIOTIC DAILY PO) Take by mouth daily.     No current facility-administered medications for this visit.     Allergies: Patient has no known allergies.  Past Medical History:  Diagnosis Date  . DM (diabetes mellitus), gestational   . Hypertension   . Kidney stone   . Plantar  fasciitis     Past Surgical History:  Procedure Laterality Date  . DILATION AND CURETTAGE OF UTERUS    . ESOPHAGEAL MANOMETRY N/A 03/29/2016   Procedure: ESOPHAGEAL MANOMETRY (EM);  Surgeon: Meryl DareMalcolm T Stark, MD;  Location: [facility];  Service: Endoscopy;  Laterality: N/A;  . HERNIA REPAIR    . INGUINAL HERNIA REPAIR  2002   right  . WISDOM TOOTH EXTRACTION  01/20/2017    Family History  Problem Relation Age of Onset  . Colon cancer Paternal Uncle 7543  . Hyperlipidemia Mother   . Hypertension Mother   . Hyperlipidemia Father   . Hypertension Father   . Hypertension Other   . Coronary artery disease Neg Hx   . Esophageal cancer Neg Hx   . Rectal cancer Neg Hx   . Stomach cancer Neg Hx     Social History   Tobacco Use  . Smoking status: Never Smoker  . Smokeless tobacco: Never Used  Substance Use Topics  . Alcohol use: Yes    Alcohol/week: 1.8 oz    Types: 3 Glasses of wine per week    Subjective:  Follow up on hypertension; blood pressure was noted to be elevated at urology appointment last week- admitted that had not been taking medication daily; started taking medication daily and is concerned that blood pressure not controlled; notes in general she does not feel she has done well on Losartan since being  changed from Valsartan earlier this year; seen at ER yesterday with concerns about uncontrolled blood pressure;  Amlodipine was added yesterday by ER provider; concerned that blood pressure not responding to  Amlodipine either; has only taken one dose of Amlodipine but is concerned that pressure is not markedly lower today; Denies any chest pain, shortness of breath, blurred vision or headache.    Objective:  Vitals:   03/15/18 1053  BP: (!) 180/110  Pulse: 94  Temp: 98.2 F (36.8 C)  TempSrc: Oral  SpO2: 99%  Weight: 152 lb 0.6 oz (69 kg)  Height: 5\' 1"  (1.549 m)    General: Well developed, well nourished, in no acute distress  Skin : Warm and dry.  Head:  Normocephalic and atraumatic  Eyes: Sclera and conjunctiva clear; pupils round and reactive to light; extraocular movements intact  Ears: External normal; canals clear; tympanic membranes normal  Oropharynx: Pink, supple. No suspicious lesions  Neck: Supple without thyromegaly, adenopathy  Lungs: Respirations unlabored; clear to auscultation bilaterally without wheeze, rales, rhonchi  CVS exam: normal rate and regular rhythm.  Neurologic: Alert and oriented; speech intact; face symmetrical; moves all extremities well; CNII-XII intact without focal deficit   Assessment:  1. Hypertension, unspecified type     Plan:  EKG does not show acute/ ischemic changes in office; reassurance that she has not had enough time to get full response to Amlodipine; for the short-term, will have her take Losartan and  Amlodipine bid; will contact her urologist to see if there are any concerns about changing her back to Valsartan HCT- there are continuing kidney issues from her recent kidney stone surgery; she will call back with reading tomorrow; follow-up in 2 weeks, sooner prn.   Return in about 2 weeks (around 03/29/2018) for with Dr. Posey Rea for blood pressure check.  Orders Placed This Encounter  Procedures  . EKG 12-Lead    Requested Prescriptions    No prescriptions requested or ordered in this encounter

## 2018-03-15 NOTE — Telephone Encounter (Signed)
Pt. Reports she went to ED yesterday for elevated BP. Started on Norvasc 5 mg yesterday. Pt. Reports BP still elevated at 200/100 today. Has headache - denies chest pain or shortness of breath or blurred vision. Pt. States she is concerned with having an elevated BP "for so long." Pt. Will be seen this morning by Ms. Dayton ScrapeMurray.

## 2018-03-15 NOTE — Telephone Encounter (Signed)
Patient is seeing Ria ClockLaura Murray today.

## 2018-03-16 ENCOUNTER — Telehealth: Payer: Self-pay | Admitting: Family

## 2018-03-16 ENCOUNTER — Telehealth: Payer: Self-pay

## 2018-03-16 DIAGNOSIS — I10 Essential (primary) hypertension: Secondary | ICD-10-CM

## 2018-03-16 MED ORDER — CLONIDINE HCL 0.1 MG PO TABS: 0.1000 mg | ORAL_TABLET | Freq: Two times a day (BID) | ORAL | 1 refills | Status: DC

## 2018-03-16 NOTE — Telephone Encounter (Signed)
[facility] called and said yes that's fine to take the Valsartin because the blood pressure needs to be controlled.

## 2018-03-16 NOTE — Telephone Encounter (Signed)
Please call [facility]rick at [facility] to see if he would have any concerns with her re-starting Valsartan HCT; her blood pressure is not well controlled on current regimen but wanted his okay before re-starting the diuretic.

## 2018-03-16 NOTE — Addendum Note (Signed)
Addended by: Eustace MooreMURRAY, Heloise Gordan W on: 03/16/2018 04:27 PM   Modules accepted: Orders

## 2018-03-16 NOTE — Telephone Encounter (Signed)
(  Readings from this morning per patient)  Mar 16, 2018 11:10 AM Crist InfanteHarrald, Kathy J wrote: This am readings  188/115.  That is with 2 pills. Pt is at Bible study and will take a second reading when she gets home and will call back again around 1pm.

## 2018-03-16 NOTE — Telephone Encounter (Signed)
Just to keep her in the loop- have not heard back from her urologist yet; I don't like these numbers at all; I am starting to question if the Losartan could be part of the problem. I want her to hold that medicine for now; keep the Amlodipine 5 mg twice a day and am going to add Clonidine 0.1 mg twice a day; I am also going to order a renal artery ultrasound to get a different look at her kidneys than what her urologist would have done.   Please call back with her numbers in the morning.

## 2018-03-16 NOTE — Telephone Encounter (Signed)
Called patient today and left message that I was calling to check on her to see how she was feeling and how her pressure was running. Also left message that I was waiting to hear back from [place] from Dr. Thalia PartyHeddrick's office regarding her medication  Options and would contact her back with plan once we heard back. CRM created incase she returns call to clinic.

## 2018-03-16 NOTE — Telephone Encounter (Signed)
Spoke with patient and plan given. She has been told to take only one of the Clonidine today then start tomorrow BID.  I will contact Cindy with Dr. Jasmine AweHerrick's office tomorrow to confirm:  1. She can take Valsartan HTC 2. Confirm she didn't have Renal Artery Ultrasound per patient

## 2018-03-16 NOTE — Telephone Encounter (Signed)
Correction:  Dr. Jasmine AweHerrick's office.

## 2018-03-16 NOTE — Telephone Encounter (Signed)
(  FYI)  Copied from CRM [id number]. Topic: Quick Communication - Office Called Patient >> Mar 16, 2018 10:03 AM Karma GanjaSmith, Shaylynn Nulty J, CMA wrote: Reason for CRM: Called and left message for patient today. We are currently waiting to hear back from Dr. Jasmine AweHerrick's office from [facility] for game plan regarding medication. Soon as we hear back we will contact her. I also asked that she call us back to let us know how her blood pressure was doing and what her readings for thus far. >> Mar 16, 2018 11:10 AM Crist InfanteHarrald, Kathy J wrote: This am readings  188/115  That is with 2 pills. Pt is at Bible study and will take a second reading when she gets home and will call back again around 1pm.   >> Mar 16, 2018  2:18 PM Alexander BergeronBarksdale, Harvey B wrote: 2:17pm bp readings : 197/112 Pt asked laura to call after she has heard from pt's urologist

## 2018-03-16 NOTE — Telephone Encounter (Signed)
Please see how her pressure is today? Also let her know that RN at her urologist office thought we would hear something today about medication options.

## 2018-03-16 NOTE — Telephone Encounter (Signed)
(  FYI)  I spoke with Arline Aspindy, RN today with Dr. Jasmine AweHerrick's office. Info given. She said it may be this afternoon before we hear back. I did let her know about patient's blood pressure readings over the past few days.  Will keep you posted.  Ciani Rutten~

## 2018-03-17 ENCOUNTER — Telehealth: Payer: Self-pay

## 2018-03-17 ENCOUNTER — Telehealth: Payer: Self-pay | Admitting: Family

## 2018-03-17 NOTE — Telephone Encounter (Signed)
Spoke with Vernona RiegerLaura from [facility]. Okay to take Valsartan w/diuretic. Spoke with  Vernona RiegerLaura given. Vernona RiegerLaura had spoken with patient this morning and I informed patient to stick with current plan as discussed this morning. Patient okay with plan and knows we will contact her once ultrasound results are back for further action.

## 2018-03-17 NOTE — Telephone Encounter (Signed)
I called patient to check on her blood pressure with stopping Losartan and adding Clonidine last night; she notes that she started feeling much better last night after adding the Clonidine; her blood pressure this morning when she first woke up was 121/90 and while talking on the phone, she checked and pressure was at 141/90; explained to patient the concerns for using Losartan or Valsartan and need for renal ultrasound to rule out renal artery stenosis; she expressed understanding and is comfortable with current regimen until renal artery ultrasound completed.

## 2018-03-20 ENCOUNTER — Telehealth: Payer: Self-pay | Admitting: Family

## 2018-03-20 NOTE — Telephone Encounter (Signed)
Copied from CRM [phone]. Topic: Quick Communication - See Telephone Encounter >> Mar 20, 2018  9:13 AM Jolayne Hainesaylor, Brittany L wrote: CRM for notification. See Telephone encounter for: 03/20/18.   Patient wanted to give an update on her Blood Pressure readings per Ria ClockLaura Murray. She noticed that since she started the amLODipine (NORVASC) 5 MG tablet, she has had swelling in her both feet/ankles with no weight gain that she notices.   Readings:: Saturday:: 161/96, 156/97, 182/106, 103/85 (last reading, this was after she took the second cloNIDine (CATAPRES) 0.1 MG tablet ) Sunday:: 146/95, 164/81, 171/102, 128/98 Monday:: 133/85  Best number to reach her at [phone] She also said [facility]. Please advise.

## 2018-03-21 MED ORDER — AZILSARTAN MEDOXOMIL 80 MG PO TABS: 1.0000 | ORAL_TABLET | Freq: Every day | ORAL | 11 refills | Status: DC

## 2018-03-21 MED ORDER — AMLODIPINE BESYLATE 5 MG PO TABS: 2.5000 mg | ORAL_TABLET | Freq: Every day | ORAL | 0 refills | Status: DC

## 2018-03-21 NOTE — Telephone Encounter (Signed)
Stop Losartan Start Edarbi 80 mg 1 a day instead - samples and Rx Amlodipine - take 1/2 tab a day Pls check on US appt OV w/me in 2 wks  Thx

## 2018-03-21 NOTE — Telephone Encounter (Signed)
fyi

## 2018-03-22 ENCOUNTER — Telehealth: Payer: Self-pay | Admitting: Family

## 2018-03-22 ENCOUNTER — Other Ambulatory Visit: Payer: Self-pay | Admitting: Family

## 2018-03-22 DIAGNOSIS — I10 Essential (primary) hypertension: Secondary | ICD-10-CM

## 2018-03-22 MED ORDER — HYDROCHLOROTHIAZIDE 25 MG PO TABS: 25.0000 mg | ORAL_TABLET | Freq: Every day | ORAL | 0 refills | Status: DC

## 2018-03-22 NOTE — Telephone Encounter (Signed)
Patient stopped by the office with questions about blood pressure  Medications;  1) having ankle swelling on Amlodipine- 2) Wondering why her PCP told her to stop Losartan and start Raynelle Charydarbi- concerned that he was not aware of what she and I had already done 3) ? Status of renal artery ultrasound   Will stop Amlodipine and list as allergy; renal artery ultrasound was re-ordered- should be done in the next 24-48 hours- she is to call back if she does not hear about that medication; will give trial of HCTZ 25 mg and continue Clonidine 0.1 mg bid; follow- up to be determined. If no renal artery stenosis noted, will try having patient go back on Valsartan HCT.

## 2018-03-23 NOTE — Telephone Encounter (Signed)
See other TE.

## 2018-03-30 ENCOUNTER — Ambulatory Visit
Admission: RE | Admit: 2018-03-30 | Discharge: 2018-03-30 | Disposition: A | Discharge status: Home / Self Care | Payer: BLUE CROSS/BLUE SHIELD | Source: Ambulatory Visit | Attending: Family | Admitting: Family

## 2018-03-30 ENCOUNTER — Telehealth: Payer: Self-pay

## 2018-03-30 DIAGNOSIS — N1330 Unspecified hydronephrosis: Secondary | ICD-10-CM | POA: Diagnosis not present

## 2018-03-30 DIAGNOSIS — I10 Essential (primary) hypertension: Secondary | ICD-10-CM

## 2018-03-30 NOTE — Telephone Encounter (Signed)
(  Routing to Best BuyLaura) Spoke with patient and she wanted to know if you felt like she needed to be on an antibiotic at all before she heads out of town?  Please advise

## 2018-03-30 NOTE — Telephone Encounter (Signed)
Spoke with patient today and info given. Cheryl Jones will get with Dr. Posey ReaPlotnikov to check on Urologist and see who she needs to see.  Call patient and follow up after speaking with Cheryl Jones.

## 2018-03-30 NOTE — Telephone Encounter (Signed)
(  FYI) Unfortunately Cheryl Jones was off today so I spoke with Cheryl Jones another nurse working today. Informed her of what was going on. She asked me to fax over copy of report to her attention with my info and she was going to give it to one of the urologist there in office today to review. I asked that she call me back and advise patient as well. Report faxed to her attention.  As far as patient I spoke with her also. Her blood pressure is stabilizing somewhat. Yesterday her pressure at one point was 151/112 but she said this morning it was 120/88. I did give her info about obstruction. She seems to be displeased with Dr. Marlou PorchHerrick and is requesting to see a senior doctor with [facility] or someone with more experience as he has been practicing for 4 years now. She would feel more comfortable at this point if she saw someone else with more experience with her condition and said she would [facility] need be but wanted your honest medical advice as well. I will follow up with Cheryl Jones within the next 20 mins to make sure she received fax and is working on what to do with patient.  Cheryl Jones~

## 2018-03-30 NOTE — Telephone Encounter (Signed)
Will do. I just spoke with Victorino DikeJennifer from [employer]. She said Dr. Arlyss RepressMckenize reviewed report and said that her report didn't show an urgent issue. She said patient could contact their office to make apt and that she could change providers and it shouldn't be an issue. She did say however if patient starts to become feverish she would need to contact them right away to be seen. I will relay this info to her and give her the option to wait until you speak with Dr. Posey ReaPlotnikov and go from there.

## 2018-03-30 NOTE — Telephone Encounter (Signed)
See below

## 2018-03-30 NOTE — Telephone Encounter (Signed)
Can you let Clydie BraunKaren I will call her next week when I am back in the office? I don't know any urologists in the area so I am not able to provide much information there. I can check with Dr. Posey ReaPlotnikov to see who he knows.

## 2018-03-30 NOTE — Telephone Encounter (Signed)
She does not have renal artery stenosis; the changes on her ultrasound raise the concern for possible obstruction in her right ureter. How is her blood pressure currently doing?  We need to forward this to her urologist for their immediate review. Can you call Vernona RiegerLaura at [facility] and get her to help us again.   I will send a staff message to her urologist as well to try and get him to look at it quickly.

## 2018-03-30 NOTE — Telephone Encounter (Signed)
(  Routing message to Vernona RiegerLaura to advise)  Patient had ultrasound today and apparently she said her R kidney is blocked and is requesting a call back before 5 today because she has plans to go out of town.  Please advise as her scan was just done today so not sure if you have reviewed it or not.  Thanks, [employer]Carla

## 2018-04-03 ENCOUNTER — Telehealth: Payer: Self-pay

## 2018-04-03 NOTE — Telephone Encounter (Signed)
Cheryl Jones spoke with patient and she is all set.

## 2018-04-03 NOTE — Progress Notes (Signed)
Reviewed with patient; she has already spoken to her urologist and will be scheduling for surgery with him this week; she will keep her follow-up with her PCP for Wednesday as well.

## 2018-04-03 NOTE — Telephone Encounter (Signed)
Cheryl RiegerLaura spoke with patient today after message came in and patient is all set.  Copied from CRM [id number]. Topic: Inquiry >> Apr 03, 2018  3:13 PM Cheryl Jones, Cheryl Jones wrote: Reason for CRM: pt called and wanted to speak w/ the nurse of Dr. Dayton ScrapeMurray or the physician herself to speak about what direction to go concerning seeing a urologist or having surgery, call pt to advise

## 2018-04-05 ENCOUNTER — Encounter: Payer: Self-pay | Admitting: Internal Medicine

## 2018-04-05 ENCOUNTER — Other Ambulatory Visit (INDEPENDENT_AMBULATORY_CARE_PROVIDER_SITE_OTHER): Payer: BLUE CROSS/BLUE SHIELD

## 2018-04-05 ENCOUNTER — Ambulatory Visit: Payer: BLUE CROSS/BLUE SHIELD | Admitting: Internal Medicine

## 2018-04-05 DIAGNOSIS — N1330 Unspecified hydronephrosis: Secondary | ICD-10-CM | POA: Insufficient documentation

## 2018-04-05 DIAGNOSIS — I10 Essential (primary) hypertension: Secondary | ICD-10-CM

## 2018-04-05 DIAGNOSIS — N200 Calculus of kidney: Secondary | ICD-10-CM | POA: Diagnosis not present

## 2018-04-05 LAB — URINALYSIS, ROUTINE W REFLEX MICROSCOPIC
Bilirubin Urine: NEGATIVE
Hgb urine dipstick: NEGATIVE
Ketones, ur: NEGATIVE
Nitrite: NEGATIVE
RBC / HPF: NONE SEEN (ref 0–?)
Total Protein, Urine: NEGATIVE
Urine Glucose: NEGATIVE
Urobilinogen, UA: 0.2 (ref 0.0–1.0)
pH: 7 (ref 5.0–8.0)

## 2018-04-05 LAB — CBC WITH DIFFERENTIAL/PLATELET
BASOS ABS: 0.1 10*3/uL (ref 0.0–0.1)
Basophils Relative: 1.2 % (ref 0.0–3.0)
Eosinophils Absolute: 0.2 10*3/uL (ref 0.0–0.7)
Eosinophils Relative: 3.2 % (ref 0.0–5.0)
HEMATOCRIT: 39.5 % (ref 36.0–46.0)
Hemoglobin: 13.7 g/dL (ref 12.0–15.0)
LYMPHS PCT: 33 % (ref 12.0–46.0)
Lymphs Abs: 2.3 10*3/uL (ref 0.7–4.0)
MCHC: 34.8 g/dL (ref 30.0–36.0)
MCV: 89.1 fl (ref 78.0–100.0)
MONOS PCT: 5.8 % (ref 3.0–12.0)
Monocytes Absolute: 0.4 10*3/uL (ref 0.1–1.0)
NEUTROS ABS: 3.9 10*3/uL (ref 1.4–7.7)
Neutrophils Relative %: 56.8 % (ref 43.0–77.0)
PLATELETS: 245 10*3/uL (ref 150.0–400.0)
RBC: 4.43 Mil/uL (ref 3.87–5.11)
RDW: 13.9 % (ref 11.5–15.5)
WBC: 6.9 10*3/uL (ref 4.0–10.5)

## 2018-04-05 LAB — BASIC METABOLIC PANEL
BUN: 17 mg/dL (ref 6–23)
CHLORIDE: 99 meq/L (ref 96–112)
CO2: 30 meq/L (ref 19–32)
CREATININE: 1.24 mg/dL — AB (ref 0.40–1.20)
Calcium: 9.6 mg/dL (ref 8.4–10.5)
GFR: 47.54 mL/min — ABNORMAL LOW (ref >60.00)
GLUCOSE: 94 mg/dL (ref 70–99)
Potassium: 3.4 mEq/L — ABNORMAL LOW (ref 3.5–5.1)
Sodium: 136 mEq/L (ref 135–145)

## 2018-04-05 LAB — SEDIMENTATION RATE: Sed Rate: 3 mm/hr (ref 0–30)

## 2018-04-05 NOTE — Patient Instructions (Signed)
Take Clonidine at night only

## 2018-04-05 NOTE — Progress Notes (Signed)
Subjective:  Patient ID: Cheryl Jones, female    DOB: [birth year]  Age: 57 y.o. MRN: [id number]  CC: No chief complaint on file.   HPI Cheryl Jones presents for HTN and R hydronephrosis, 2 STENTs on the R C/o R flank pain x1 d C/o fatigue, dizziness  Outpatient Medications Prior to Visit  Medication Sig Dispense Refill  . calcium-vitamin D 250-100 MG-UNIT tablet Take 1 tablet by mouth daily.    . Cholecalciferol (VITAMIN D PO) Take 1 tablet by mouth daily.    . cloNIDine (CATAPRES) 0.1 MG tablet Take 1 tablet (0.1 mg total) by mouth 2 (two) times daily. 60 tablet 1  . hydrochlorothiazide (HYDRODIURIL) 25 MG tablet Take 1 tablet (25 mg total) by mouth daily. 90 tablet 0  . loratadine (CLARITIN) 10 MG tablet Take 10 mg by mouth daily.      Marland Kitchen MAGNESIUM PO Take 150 mg by mouth daily.    . Nutritional Supplements (JUICE PLUS FIBRE PO) Take 2 tablets by mouth daily.    Marland Kitchen omeprazole (PRILOSEC) 40 MG capsule Take 1 capsule (40 mg total) by mouth daily. 90 capsule 3  . Probiotic Product (PROBIOTIC DAILY PO) Take by mouth daily.    Marland Kitchen ibuprofen (ADVIL,MOTRIN) 200 MG tablet Take 400 mg by mouth 2 (two) times daily. FOR 6 WEEKS FOR KIDNEY/BLADDER INFLAMMATION    . Azilsartan Medoxomil (EDARBI) 80 MG TABS Take 1 tablet (80 mg total) by mouth daily. 30 tablet 11   No facility-administered medications prior to visit.     ROS Review of Systems  Constitutional: Negative for activity change, appetite change, chills, fatigue and unexpected weight change.  HENT: Negative for congestion, mouth sores and sinus pressure.   Eyes: Negative for visual disturbance.  Respiratory: Negative for cough and chest tightness.   Gastrointestinal: Negative for abdominal pain and nausea.  Genitourinary: Negative for difficulty urinating, frequency and vaginal pain.  Musculoskeletal: Positive for back pain. Negative for gait problem.  Skin: Negative for pallor and rash.  Neurological: Negative for dizziness,  tremors, weakness, numbness and headaches.  Psychiatric/Behavioral: Negative for confusion and sleep disturbance.    Objective:  BP 136/84 (BP Location: Right Arm, Patient Position: Sitting, Cuff Size: Normal)   Pulse 62   Temp 97.9 F (36.6 C) (Oral)   Ht 5\' 1"  (1.549 m)   Wt 150 lb (68 kg)   SpO2 99%   BMI 28.34 kg/m   BP Readings from Last 3 Encounters:  04/05/18 136/84  03/15/18 (!) 180/110  03/14/18 (!) 201/102    Wt Readings from Last 3 Encounters:  04/05/18 150 lb (68 kg)  03/15/18 152 lb 0.6 oz (69 kg)  01/18/18 147 lb (66.7 kg)    Physical Exam  Constitutional: She appears well-developed. No distress.  HENT:  Head: Normocephalic.  Right Ear: External ear normal.  Left Ear: External ear normal.  Nose: Nose normal.  Mouth/Throat: Oropharynx is clear and moist.  Eyes: Pupils are equal, round, and reactive to light. Conjunctivae are normal. Right eye exhibits no discharge. Left eye exhibits no discharge.  Neck: Normal range of motion. Neck supple. No JVD present. No tracheal deviation present. No thyromegaly present.  Cardiovascular: Normal rate, regular rhythm and normal heart sounds.  Pulmonary/Chest: No stridor. No respiratory distress. She has no wheezes.  Abdominal: Soft. Bowel sounds are normal. She exhibits no distension and no mass. There is no tenderness. There is no rebound and no guarding.  Musculoskeletal: She exhibits no edema or tenderness.  Lymphadenopathy:  She has no cervical adenopathy.  Neurological: She displays normal reflexes. No cranial nerve deficit. She exhibits normal muscle tone. Coordination normal.  Skin: No rash noted. No erythema.  Psychiatric: She has a normal mood and affect. Her behavior is normal. Judgment and thought content normal.    Lab Results  Component Value Date   WBC 8.2 01/18/2018   HGB 13.2 01/18/2018   HCT 39.3 01/18/2018   PLT 393.0 01/18/2018   GLUCOSE 91 03/14/2018   CHOL 223 (H) 05/05/2017   TRIG 96.0  05/05/2017   HDL 47.10 05/05/2017   LDLDIRECT 160.9 04/20/2013   LDLCALC 156 (H) 05/05/2017   ALT 11 01/18/2018   AST 12 01/18/2018   NA 142 03/14/2018   K 4.0 03/14/2018   CL 107 03/14/2018   CREATININE 1.07 (H) 03/14/2018   BUN 21 (H) 03/14/2018   CO2 26 03/14/2018   TSH 0.29 (L) 05/05/2017   HGBA1C 5.7 01/02/2018    US Renal Artery Stenosis  Result Date: 03/30/2018 CLINICAL DATA:  57 year old with uncontrolled essential hypertension. EXAM: RENAL/URINARY TRACT ULTRASOUND RENAL DUPLEX DOPPLER ULTRASOUND COMPARISON:  Abdominal and pelvic CT 03/08/2018 FINDINGS: Right Kidney: Length: 7.9 cm. Atrophy in the right kidney with normal echogenicity. There is moderate right hydronephrosis and similar to the recent CT. Dilatation of the right renal pelvis. Cannot exclude some echogenic material in the renal pelvis. Left Kidney: Length: 10.2 cm. Echogenicity within normal limits. No mass or hydronephrosis visualized. Bladder: Normal appearance of the bladder. There is a left ureter jet. Right ureter jet not identified. RENAL DUPLEX ULTRASOUND Right Renal Artery Velocities: Origin:  104 cm/sec Mid:  92 cm/sec Hilum:  46 cm/sec Interlobar:  39 cm/sec Arcuate:  15 cm/sec Left Renal Artery Velocities: Origin:  87 cm/sec Mid:  164 cm/sec Hilum:  145 cm/sec Interlobar:  57 cm/sec Arcuate:  21 cm/sec Aortic Velocity:  135 cm/sec Right Renal-Aortic Ratios: Origin: 0.8 Mid:  0.7 Hilum: 0.3 Interlobar: 0.3 Arcuate: 0.1 Left Renal-Aortic Ratios: Origin: 0.6 Mid: 1.2 Hilum: 1.1 Interlobar: 0.4 Arcuate: 0.2 Bilateral renal veins are patent. Velocity measurements in the renal arteries are within normal limits and no evidence for renal artery stenosis. IMPRESSION: No evidence for renal artery stenosis. Persistent moderate right hydronephrosis and no evidence for a right ureter jet in the bladder. Right hydronephrosis is similar to the recent CT and findings raise concern for a persistent right ureter obstruction or a  stricture. Right renal atrophy. Electronically Signed   By: Richarda Overlie M.D.   On: 03/30/2018 11:23    Assessment & Plan:   There are no diagnoses linked to this encounter. I have discontinued Tambra Kuhrt's Azilsartan Medoxomil. I am also having her maintain her loratadine, Nutritional Supplements (JUICE PLUS FIBRE PO), Probiotic Product (PROBIOTIC DAILY PO), Cholecalciferol (VITAMIN D PO), omeprazole, MAGNESIUM PO, calcium-vitamin D, ibuprofen, cloNIDine, and hydrochlorothiazide.  No orders of the defined types were placed in this encounter.    Follow-up: No follow-ups on file.  Sonda Primes, MD

## 2018-04-05 NOTE — Telephone Encounter (Signed)
-----   Message from Crist FatBenjamin W Herrick, MD sent at 04/04/2018  6:05 AM EDT ----- Thanks for reaching out, I am taking her back to the OR to investigate that right side.  I'll let you know what we find.  However, I dont think that the HTN is related to her hydro.  ----- Message ----- From: Olive BassMurray, Laura Woodruff, FNP Sent: 03/30/2018   1:23 PM To: Crist FatBenjamin W Herrick, MD  Dr. Marlou PorchHerrick,  [facility] am a [facility] and have been working with Clydie BraunKaren Roeper to get control of her blood pressure. I was concerned about renal artery stenosis and had an ultrasound done. The ultrasound did not show renal artery stenosis but is raising the question of persisting right ureter obstruction or stricture as well as right renal atrophy.  I am having my MA forward these results to your office now and have asked Clydie BraunKaren to call your office for follow-up.   Thank you for your time- Ria ClockLaura Murray

## 2018-04-05 NOTE — Assessment & Plan Note (Signed)
Reduce Clonidine

## 2018-04-05 NOTE — Assessment & Plan Note (Signed)
Cystoscopy pendining Dr Marlou PorchHerrick

## 2018-04-05 NOTE — Assessment & Plan Note (Signed)
STENT x2 Dr Marlou PorchHerrick

## 2018-04-06 ENCOUNTER — Other Ambulatory Visit: Payer: Self-pay | Admitting: Internal Medicine

## 2018-04-06 MED ORDER — POTASSIUM CHLORIDE ER 8 MEQ PO TBCR: 8.0000 meq | EXTENDED_RELEASE_TABLET | Freq: Every day | ORAL | 0 refills | Status: DC

## 2018-04-07 ENCOUNTER — Other Ambulatory Visit: Payer: Self-pay | Admitting: Family

## 2018-04-11 ENCOUNTER — Encounter: Payer: Self-pay | Admitting: *Deleted

## 2018-04-12 ENCOUNTER — Telehealth: Payer: Self-pay | Admitting: Internal Medicine

## 2018-04-12 ENCOUNTER — Encounter: Payer: Self-pay | Admitting: Internal Medicine

## 2018-04-12 NOTE — Telephone Encounter (Signed)
I will let Dr. Posey Rea address since he is PCP and saw her last to adjust BP meds.

## 2018-04-12 NOTE — Telephone Encounter (Signed)
Would you like to advise or have Dr. Posey Rea address?

## 2018-04-12 NOTE — Telephone Encounter (Signed)
Please advise. Thanks.  

## 2018-04-12 NOTE — Telephone Encounter (Signed)
Copied from CRM [id number]. Topic: Inquiry >> Apr 12, 2018  3:05 PM Maia Petties wrote: Reason for CRM: pt states that she sent a mychart msg regarding BP and medications. BP has been high. Pt declined NT stating that she is working on this with Dr. Posey Rea and Ria Clock, NP and they will know what to do. Pt is hoping for a call back today

## 2018-04-13 ENCOUNTER — Other Ambulatory Visit: Payer: Self-pay | Admitting: Internal Medicine

## 2018-04-13 MED ORDER — NEBIVOLOL HCL 10 MG PO TABS: 10.0000 mg | ORAL_TABLET | Freq: Every day | ORAL | 11 refills | Status: DC

## 2018-04-13 NOTE — Telephone Encounter (Signed)
Please pick up samples for Bystolic and start 1 a day in am. Continue other meds too. Thanks, AP

## 2018-04-14 NOTE — Telephone Encounter (Signed)
Pt picked up yesterday

## 2018-04-19 DIAGNOSIS — N131 Hydronephrosis with ureteral stricture, not elsewhere classified: Secondary | ICD-10-CM | POA: Diagnosis not present

## 2018-04-21 DIAGNOSIS — G5602 Carpal tunnel syndrome, left upper limb: Secondary | ICD-10-CM | POA: Diagnosis not present

## 2018-04-21 DIAGNOSIS — G5601 Carpal tunnel syndrome, right upper limb: Secondary | ICD-10-CM | POA: Diagnosis not present

## 2018-04-21 DIAGNOSIS — M545 Low back pain: Secondary | ICD-10-CM | POA: Diagnosis not present

## 2018-04-21 DIAGNOSIS — M25551 Pain in right hip: Secondary | ICD-10-CM | POA: Diagnosis not present

## 2018-04-25 ENCOUNTER — Telehealth: Payer: Self-pay | Admitting: Internal Medicine

## 2018-04-25 NOTE — Telephone Encounter (Unsigned)
Copied from CRM #[id number]. Topic: Quick Communication - See Telephone Encounter >> Apr 25, 2018 10:04 AM Raquel Sarna wrote: BP is getting back to normal.  Dosage needs to be adjusted. Please call pt back to discuss.

## 2018-04-27 NOTE — Telephone Encounter (Signed)
Discussed and made appt for pt

## 2018-05-04 ENCOUNTER — Encounter: Payer: Self-pay | Admitting: Internal Medicine

## 2018-05-04 ENCOUNTER — Ambulatory Visit: Payer: BLUE CROSS/BLUE SHIELD | Admitting: Internal Medicine

## 2018-05-04 VITALS — BP 138/76 | HR 72 | Ht 61.0 in | Wt 149.0 lb

## 2018-05-04 DIAGNOSIS — K219 Gastro-esophageal reflux disease without esophagitis: Secondary | ICD-10-CM

## 2018-05-04 DIAGNOSIS — K449 Diaphragmatic hernia without obstruction or gangrene: Secondary | ICD-10-CM | POA: Diagnosis not present

## 2018-05-04 DIAGNOSIS — K209 Esophagitis, unspecified without bleeding: Secondary | ICD-10-CM

## 2018-05-04 MED ORDER — OMEPRAZOLE 40 MG PO CPDR: 40.0000 mg | DELAYED_RELEASE_CAPSULE | Freq: Every day | ORAL | 1 refills | Status: DC

## 2018-05-04 NOTE — Progress Notes (Signed)
Patient ID: Cheryl Jones, female   DOB: 07/18/1961, 57 y.o.   MRN: [id number] HPI: Cheryl Jones is a 57 year old female with a past medical history of GERD with erosive esophagitis, large hiatal hernia, kidney stones, colonic diverticulosis, hypertension who is here for follow-up.  She was previously managed by Dr. Arlyce Dice and then Dr. Russella Dar but requested to switch to me.  Her parents knew my family and with this connection she wanted to see me.  She has a history of reflux esophagitis without Barrett's esophagus felt secondary to her large hiatal hernia.  This was seen at the time of upper endoscopy performed by Dr. Russella Dar in 2017.  At that time she had erosive LA grade C esophagitis, question of Barrett's which was biopsied, and a few nonbleeding gastric erosions.  Biopsies showed reflux inflammation but no Barrett's metaplasia.  There was reactive gastropathy without H. pylori from stomach biopsies.  She has been maintained on omeprazole 40 mg once daily.  This mostly controls her heartburn.  She has wondered if she could decrease the dose of this medication.  She does not have dysphagia but admits to having learned to chew her food well and eat slowly.  She feels that she has made behavioral modifications to help her swallowing.  After evaluation in 2017 with Dr. Russella Dar including high resolution esophageal manometry (which showed hypertensive LES, manometric evidence of hiatal hernia and ineffective esophageal motility with 100% bolus clearance) she was recommended for consideration of repair of hiatal hernia.  This never occurred and she was told that the surgeon felt like she would have a high rate of recurrence of hiatal hernia.  She also in 2016 developed some right sided abdominal pain right of the umbilicus.  This was crampy in nature.  She has since been diagnosed with kidney stone.  Earlier this year this became obstructive and was removed but a ureteral stricture resulted.  She now has a ureteral  stent in place.  After the stone was removed her right sided abdominal pain was gone but it has recurred though to much less severity since having the ureteral stent placed.  No bowel habit complaint.  No report of blood in stool or melena.  Her blood pressure has been difficult to manage and she is now taking nebivolol, hydrochlorothiazide and clonidine.  She had a screening colonoscopy performed by Dr. Arlyce Dice on 08/15/2013 which was normal other than moderate sigmoid diverticulosis.  Past Medical History:  Diagnosis Date  . Diverticulosis   . DM (diabetes mellitus), gestational   . Erosive esophagitis   . GERD (gastroesophageal reflux disease)   . Hiatal hernia   . Hypertension   . Kidney stone   . Plantar fasciitis     Past Surgical History:  Procedure Laterality Date  . DILATION AND CURETTAGE OF UTERUS    . ESOPHAGEAL MANOMETRY N/A 03/29/2016   Procedure: ESOPHAGEAL MANOMETRY (EM);  Surgeon: Meryl Dare, MD;  Location: [facility];  Service: Endoscopy;  Laterality: N/A;  . HERNIA REPAIR    . INGUINAL HERNIA REPAIR  2002   right  . WISDOM TOOTH EXTRACTION  01/20/2017    Outpatient Medications Prior to Visit  Medication Sig Dispense Refill  . calcium-vitamin D 250-100 MG-UNIT tablet Take 1 tablet by mouth daily.    . Cholecalciferol (VITAMIN D PO) Take 1 tablet by mouth daily.    . cloNIDine (CATAPRES) 0.1 MG tablet Take 1 tablet (0.1 mg total) by mouth daily. 30 tablet 1  . hydrochlorothiazide (HYDRODIURIL)  25 MG tablet Take 1 tablet (25 mg total) by mouth daily. 90 tablet 0  . ibuprofen (ADVIL,MOTRIN) 200 MG tablet Take 400 mg by mouth 2 (two) times daily. FOR 6 WEEKS FOR KIDNEY/BLADDER INFLAMMATION    . loratadine (CLARITIN) 10 MG tablet Take 10 mg by mouth daily.      Marland Kitchen MAGNESIUM PO Take 150 mg by mouth daily.    . nebivolol (BYSTOLIC) 10 MG tablet Take 1 tablet (10 mg total) by mouth daily. 30 tablet 11  . Nutritional Supplements (JUICE PLUS FIBRE PO) Take 2 tablets  by mouth daily.    . potassium chloride (KLOR-CON) 8 MEQ tablet Take 1 tablet (8 mEq total) by mouth daily. 15 tablet 0  . Probiotic Product (PROBIOTIC DAILY PO) Take by mouth daily.    Marland Kitchen omeprazole (PRILOSEC) 40 MG capsule Take 1 capsule (40 mg total) by mouth daily. 90 capsule 3   No facility-administered medications prior to visit.     Allergies  Allergen Reactions  . Amlodipine     swelling    Family History  Problem Relation Age of Onset  . Colon cancer Paternal Uncle 71  . Hyperlipidemia Mother   . Hypertension Mother   . Hyperlipidemia Father   . Hypertension Father   . Hypertension Other   . Coronary artery disease Neg Hx   . Esophageal cancer Neg Hx   . Rectal cancer Neg Hx   . Stomach cancer Neg Hx     Social History   Tobacco Use  . Smoking status: Never Smoker  . Smokeless tobacco: Never Used  Substance Use Topics  . Alcohol use: Yes    Alcohol/week: 1.8 oz    Types: 3 Glasses of wine per week  . Drug use: No    ROS: As per history of present illness, otherwise negative  BP 138/76   Pulse 72   Ht  (1.549 m)   Wt 149 lb (67.6 kg)   BMI 28.15 kg/m  Constitutional: Well-developed and well-nourished. No distress. HEENT: Normocephalic and atraumatic. Oropharynx is clear and moist. Conjunctivae are normal.  No scleral icterus. Neck: Neck supple. Trachea midline. Cardiovascular: Normal rate, regular rhythm and intact distal pulses. No M/R/G Pulmonary/chest: Effort normal and breath sounds normal. No wheezing, rales or rhonchi. Abdominal: Soft, nontender, nondistended. Bowel sounds active throughout. There are no masses palpable. No hepatosplenomegaly. Extremities: no clubbing, cyanosis, or edema Neurological: Alert and oriented to person place and time. Skin: Skin is warm and dry.  Psychiatric: Normal mood and affect. Behavior is normal.  RELEVANT LABS AND IMAGING: CBC    Component Value Date/Time   WBC 6.9 04/05/2018 1014   RBC 4.43  04/05/2018 1014   HGB 13.7 04/05/2018 1014   HCT 39.5 04/05/2018 1014   PLT 245.0 04/05/2018 1014   MCV 89.1 04/05/2018 1014   MCV 91.4 08/23/2015 1611   MCH 31.5 01/30/2016 1645   MCHC 34.8 04/05/2018 1014   RDW 13.9 04/05/2018 1014   LYMPHSABS 2.3 04/05/2018 1014   MONOABS 0.4 04/05/2018 1014   EOSABS 0.2 04/05/2018 1014   BASOSABS 0.1 04/05/2018 1014    CMP     Component Value Date/Time   NA 136 04/05/2018 1014   K 3.4 (L) 04/05/2018 1014   CL 99 04/05/2018 1014   CO2 30 04/05/2018 1014   GLUCOSE 94 04/05/2018 1014   BUN 17 04/05/2018 1014   CREATININE 1.24 (H) 04/05/2018 1014   CALCIUM 9.6 04/05/2018 1014   PROT 7.7 01/18/2018  1221   ALBUMIN 4.0 01/18/2018 1221   AST 12 01/18/2018 1221   ALT 11 01/18/2018 1221   ALKPHOS 100 01/18/2018 1221   BILITOT 0.5 01/18/2018 1221   GFRNONAA 57 (L) 03/14/2018 1450   GFRAA >60 03/14/2018 1450   UPPER GI SERIES WITH KUB   TECHNIQUE: After obtaining a scout radiograph a routine upper GI series was performed using thin and high density barium.   FLUOROSCOPY TIME:  Radiation Exposure Index (as provided by the fluoroscopic device): 563 microGy*m^2   COMPARISON:  02/18/2016   FINDINGS: Initial KUB demonstrates a hernia mesh markers in the left lower quadrant.   The pharyngeal phase of swallowing appears normal.   Double contrast portion of the exam demonstrates no esophageal stricture or ulceration. Moderate to large hiatal hernia noted; currently this is smaller than on the prior CT scan, and type 1. Primary peristaltic waves or normal on 4/4 swallows. A 13 mm barium tablet passed without difficulty into the stomach.   The gastritis observed on recent endoscopy is not readily apparent. Normal duodenal bulb and proximal duodenum. Normal duodenal configuration. No mass identified.   IMPRESSION: 1. No ulceration of the stomach or esophagus currently identified. Moderate to large hiatal hernia, type 1, although smaller  than on the recent CT scan. 2. No findings of dysmotility. The pharyngeal phase of swallowing appeared normal.     Electronically Signed   By: Gaylyn Rong M.D.   On: 03/19/2016 12:22   CT ABDOMEN AND PELVIS WITHOUT CONTRAST   TECHNIQUE: Multidetector CT imaging of the abdomen and pelvis was performed following the standard protocol without IV contrast.   COMPARISON:  02/18/2016   FINDINGS: Lower chest: Redemonstration of large hiatal hernia. The previous herniation of portions of the colon, pancreas and portions of the splenic vein are no longer present.   Hepatobiliary: No focal liver abnormality is seen given limitations of a noncontrast exam. No gallstones, gallbladder wall thickening, or biliary dilatation.   Pancreas: Unremarkable. No pancreatic ductal dilatation or surrounding inflammatory changes.   Spleen: Normal in size without focal abnormality.   Adrenals/Urinary Tract: Punctate bilateral renal calculi. Asymmetric enlargement of the right kidney with perinephric fat stranding and mild-to-moderate hydroureteronephrosis is identified. There is a 7 mm distal right ureteral calculus approximately 1.5 cm from the right the ureterovesical juncture likely the cause of the patient's hydroureteronephrosis.   Stomach/Bowel: Large hiatal hernia. Normal small bowel rotation. Sigmoid diverticulosis without acute diverticulitis.   Vascular/Lymphatic: Normal caliber aorta.  No adenopathy.   Reproductive: Uterus and bilateral adnexa are unremarkable.   Other: No abdominal wall hernia or abnormality. No abdominopelvic ascites.   Musculoskeletal: No acute or significant osseous findings. Left lower quadrant hernia repair with surgical tacks in place.   IMPRESSION: 1. 7 mm distal right ureteral calculus approximately 1.5 cm from the UVJ causing mild-to-moderate right-sided hydroureteronephrosis. 2. Bilateral punctate nonobstructing renal calculi. 3. Large  hiatal hernia containing most of the stomach. 4. Colonic diverticulosis without acute diverticulitis.     Electronically Signed   By: Tollie Eth M.D.   On: 01/02/2018 19:38    ASSESSMENT/PLAN: 57 year old female with a past medical history of GERD with erosive esophagitis, large hiatal hernia, kidney stones, colonic diverticulosis, hypertension who is here for follow-up.   1.  large hiatal hernia/GERD with history of reflux esophagitis --currently she does not have symptoms of uncontrolled reflux nor troublesome dysphagia.  There is question as to whether she could reduce PPI therapy.  We discussed how in  patients with renal dysfunction PPI also needs to be reduce and discontinued in some patients.  She reports her kidney function has been stable.   after our discussion I recommend upper endoscopy to reevaluate her esophagus and ensure healing of her esophagitis.  This will also allow for exclusion of Barrett's esophagus.  There was no Barrett's esophagus seen at last biopsy though there was acute inflammation which at times can prevent adequate diagnosis.  I am going to continue omeprazole 40 mg once daily for now but would be open to changing this if necessary for renal function to high dose twice daily H2 blocker.  2.  CRC screening --repeat colonoscopy September 2024  30 minutes spent with the patient today. Greater than 50% was spent in counseling and coordination of care with the patient     JW:[id number], Georgina Quint, Md [address] [place], [place] [address]

## 2018-05-04 NOTE — Patient Instructions (Signed)
You have been scheduled for an endoscopy. Please follow written instructions given to you at your visit today. If you use inhalers (even only as needed), please bring them with you on the day of your procedure. Your physician has requested that you go to www.startemmi.com and enter the access code given to you at your visit today. This web site gives a general overview about your procedure. However, you should still follow specific instructions given to you by our office regarding your preparation for the procedure.  Continue omeprazole 40 mg daily.  If you are age 77 or older, your body mass index should be between 23-30. Your Body mass index is 28.15 kg/m. If this is out of the aforementioned range listed, please consider follow up with your Primary Care Provider.  If you are age 35 or younger, your body mass index should be between 19-25. Your Body mass index is 28.15 kg/m. If this is out of the aformentioned range listed, please consider follow up with your Primary Care Provider.

## 2018-05-05 DIAGNOSIS — N1330 Unspecified hydronephrosis: Secondary | ICD-10-CM | POA: Diagnosis not present

## 2018-05-10 ENCOUNTER — Encounter: Payer: Self-pay | Admitting: Internal Medicine

## 2018-05-10 DIAGNOSIS — M545 Low back pain: Secondary | ICD-10-CM | POA: Diagnosis not present

## 2018-05-10 DIAGNOSIS — M25551 Pain in right hip: Secondary | ICD-10-CM | POA: Diagnosis not present

## 2018-05-10 DIAGNOSIS — G5602 Carpal tunnel syndrome, left upper limb: Secondary | ICD-10-CM | POA: Diagnosis not present

## 2018-05-10 DIAGNOSIS — G5601 Carpal tunnel syndrome, right upper limb: Secondary | ICD-10-CM | POA: Diagnosis not present

## 2018-05-16 DIAGNOSIS — M545 Low back pain: Secondary | ICD-10-CM | POA: Diagnosis not present

## 2018-05-16 DIAGNOSIS — G5601 Carpal tunnel syndrome, right upper limb: Secondary | ICD-10-CM | POA: Diagnosis not present

## 2018-05-16 DIAGNOSIS — G5602 Carpal tunnel syndrome, left upper limb: Secondary | ICD-10-CM | POA: Diagnosis not present

## 2018-05-16 DIAGNOSIS — M25551 Pain in right hip: Secondary | ICD-10-CM | POA: Diagnosis not present

## 2018-05-25 ENCOUNTER — Other Ambulatory Visit (INDEPENDENT_AMBULATORY_CARE_PROVIDER_SITE_OTHER): Payer: BLUE CROSS/BLUE SHIELD

## 2018-05-25 ENCOUNTER — Encounter: Payer: Self-pay | Admitting: Internal Medicine

## 2018-05-25 ENCOUNTER — Ambulatory Visit: Payer: BLUE CROSS/BLUE SHIELD | Admitting: Internal Medicine

## 2018-05-25 VITALS — BP 112/78 | HR 59 | Temp 97.7°F | Ht 61.0 in | Wt 149.0 lb

## 2018-05-25 DIAGNOSIS — N1330 Unspecified hydronephrosis: Secondary | ICD-10-CM | POA: Diagnosis not present

## 2018-05-25 DIAGNOSIS — E785 Hyperlipidemia, unspecified: Secondary | ICD-10-CM

## 2018-05-25 DIAGNOSIS — I10 Essential (primary) hypertension: Secondary | ICD-10-CM | POA: Diagnosis not present

## 2018-05-25 DIAGNOSIS — M25551 Pain in right hip: Secondary | ICD-10-CM | POA: Diagnosis not present

## 2018-05-25 DIAGNOSIS — K2100 Gastro-esophageal reflux disease with esophagitis, without bleeding: Secondary | ICD-10-CM

## 2018-05-25 DIAGNOSIS — R1031 Right lower quadrant pain: Secondary | ICD-10-CM

## 2018-05-25 DIAGNOSIS — N390 Urinary tract infection, site not specified: Secondary | ICD-10-CM | POA: Diagnosis not present

## 2018-05-25 DIAGNOSIS — G5601 Carpal tunnel syndrome, right upper limb: Secondary | ICD-10-CM | POA: Diagnosis not present

## 2018-05-25 DIAGNOSIS — R319 Hematuria, unspecified: Secondary | ICD-10-CM

## 2018-05-25 DIAGNOSIS — G5602 Carpal tunnel syndrome, left upper limb: Secondary | ICD-10-CM | POA: Diagnosis not present

## 2018-05-25 DIAGNOSIS — M545 Low back pain: Secondary | ICD-10-CM | POA: Diagnosis not present

## 2018-05-25 LAB — BASIC METABOLIC PANEL
BUN: 20 mg/dL (ref 6–23)
CALCIUM: 9.8 mg/dL (ref 8.4–10.5)
CO2: 29 meq/L (ref 19–32)
CREATININE: 1.35 mg/dL — AB (ref 0.40–1.20)
Chloride: 100 mEq/L (ref 96–112)
GFR: 43.07 mL/min — ABNORMAL LOW (ref >60.00)
Glucose, Bld: 100 mg/dL — ABNORMAL HIGH (ref 70–99)
Potassium: 3.3 mEq/L — ABNORMAL LOW (ref 3.5–5.1)
SODIUM: 140 meq/L (ref 135–145)

## 2018-05-25 LAB — LIPID PANEL
CHOL/HDL RATIO: 5
Cholesterol: 309 mg/dL — ABNORMAL HIGH (ref 0–200)
HDL: 59.8 mg/dL (ref >39.00)
LDL Cholesterol: 214 mg/dL — ABNORMAL HIGH (ref 0–99)
NONHDL: 248.81
Triglycerides: 175 mg/dL — ABNORMAL HIGH (ref 0.0–149.0)
VLDL: 35 mg/dL (ref 0.0–40.0)

## 2018-05-25 MED ORDER — ICOSAPENT ETHYL 1 G PO CAPS: 2.0000 | ORAL_CAPSULE | Freq: Two times a day (BID) | ORAL | 11 refills | Status: DC

## 2018-05-25 NOTE — Assessment & Plan Note (Signed)
Clonidine prn HCTZ, Bystolic

## 2018-05-25 NOTE — Assessment & Plan Note (Signed)
Better ?etiology

## 2018-05-25 NOTE — Assessment & Plan Note (Signed)
Doing well 

## 2018-05-25 NOTE — Assessment & Plan Note (Signed)
S/p R stent F/u w/Urology

## 2018-05-25 NOTE — Progress Notes (Signed)
Subjective:  Patient ID: Cheryl Jones, female    DOB: Jun 20, 1961  Age: 57 y.o. MRN: [id number]  CC: No chief complaint on file.   HPI Cheryl Jones presents for HTN - off Clonidine x 1-2 weeks F/u ureter stenosis F/u dyslipidemia  Outpatient Medications Prior to Visit  Medication Sig Dispense Refill  . calcium-vitamin D 250-100 MG-UNIT tablet Take 1 tablet by mouth daily.    . Cholecalciferol (VITAMIN D PO) Take 1 tablet by mouth daily.    . hydrochlorothiazide (HYDRODIURIL) 25 MG tablet Take 1 tablet (25 mg total) by mouth daily. 90 tablet 0  . loratadine (CLARITIN) 10 MG tablet Take 10 mg by mouth daily.      Marland Kitchen MAGNESIUM PO Take 150 mg by mouth daily.    . nebivolol (BYSTOLIC) 10 MG tablet Take 1 tablet (10 mg total) by mouth daily. 30 tablet 11  . Nutritional Supplements (JUICE PLUS FIBRE PO) Take 2 tablets by mouth daily.    Marland Kitchen omeprazole (PRILOSEC) 40 MG capsule Take 1 capsule (40 mg total) by mouth daily. 90 capsule 1  . Probiotic Product (PROBIOTIC DAILY PO) Take by mouth daily.    . cloNIDine (CATAPRES) 0.1 MG tablet Take 1 tablet (0.1 mg total) by mouth daily. 30 tablet 1  . ibuprofen (ADVIL,MOTRIN) 200 MG tablet Take 400 mg by mouth 2 (two) times daily. FOR 6 WEEKS FOR KIDNEY/BLADDER INFLAMMATION    . potassium chloride (KLOR-CON) 8 MEQ tablet Take 1 tablet (8 mEq total) by mouth daily. 15 tablet 0   No facility-administered medications prior to visit.     ROS: Review of Systems  Constitutional: Negative for activity change, appetite change, chills, fatigue and unexpected weight change.  HENT: Negative for congestion, mouth sores and sinus pressure.   Eyes: Negative for visual disturbance.  Respiratory: Negative for cough and chest tightness.   Gastrointestinal: Negative for abdominal pain and nausea.  Genitourinary: Negative for difficulty urinating, frequency and vaginal pain.  Musculoskeletal: Negative for back pain and gait problem.  Skin: Negative for pallor and  rash.  Neurological: Negative for dizziness, tremors, weakness, numbness and headaches.  Psychiatric/Behavioral: Negative for confusion, sleep disturbance and suicidal ideas.    Objective:  BP 112/78 (BP Location: Left Arm, Patient Position: Sitting, Cuff Size: Large)   Pulse (!) 59   Temp 97.7 F (36.5 C) (Oral)   Ht 5\' 1"  (1.549 m)   Wt 149 lb (67.6 kg)   SpO2 99%   BMI 28.15 kg/m   BP Readings from Last 3 Encounters:  05/25/18 112/78  05/04/18 138/76  04/05/18 136/84    Wt Readings from Last 3 Encounters:  05/25/18 149 lb (67.6 kg)  05/04/18 149 lb (67.6 kg)  04/05/18 150 lb (68 kg)    Physical Exam  Constitutional: She appears well-developed. No distress.  HENT:  Head: Normocephalic.  Right Ear: External ear normal.  Left Ear: External ear normal.  Nose: Nose normal.  Mouth/Throat: Oropharynx is clear and moist.  Eyes: Pupils are equal, round, and reactive to light. Conjunctivae are normal. Right eye exhibits no discharge. Left eye exhibits no discharge.  Neck: Normal range of motion. Neck supple. No JVD present. No tracheal deviation present. No thyromegaly present.  Cardiovascular: Normal rate, regular rhythm and normal heart sounds.  Pulmonary/Chest: No stridor. No respiratory distress. She has no wheezes.  Abdominal: Soft. Bowel sounds are normal. She exhibits no distension and no mass. There is no tenderness. There is no rebound and no guarding.  Musculoskeletal:  She exhibits no edema or tenderness.  Lymphadenopathy:    She has no cervical adenopathy.  Neurological: She displays normal reflexes. No cranial nerve deficit. She exhibits normal muscle tone. Coordination normal.  Skin: No rash noted. No erythema.  Psychiatric: She has a normal mood and affect. Her behavior is normal. Judgment and thought content normal.    Lab Results  Component Value Date   WBC 6.9 04/05/2018   HGB 13.7 04/05/2018   HCT 39.5 04/05/2018   PLT 245.0 04/05/2018   GLUCOSE 94  04/05/2018   CHOL 223 (H) 05/05/2017   TRIG 96.0 05/05/2017   HDL 47.10 05/05/2017   LDLDIRECT 160.9 04/20/2013   LDLCALC 156 (H) 05/05/2017   ALT 11 01/18/2018   AST 12 01/18/2018   NA 136 04/05/2018   K 3.4 (L) 04/05/2018   CL 99 04/05/2018   CREATININE 1.24 (H) 04/05/2018   BUN 17 04/05/2018   CO2 30 04/05/2018   TSH 0.29 (L) 05/05/2017   HGBA1C 5.7 01/02/2018    US Renal Artery Stenosis  Result Date: 03/30/2018 CLINICAL DATA:  57 year old with uncontrolled essential hypertension. EXAM: RENAL/URINARY TRACT ULTRASOUND RENAL DUPLEX DOPPLER ULTRASOUND COMPARISON:  Abdominal and pelvic CT 03/08/2018 FINDINGS: Right Kidney: Length: 7.9 cm. Atrophy in the right kidney with normal echogenicity. There is moderate right hydronephrosis and similar to the recent CT. Dilatation of the right renal pelvis. Cannot exclude some echogenic material in the renal pelvis. Left Kidney: Length: 10.2 cm. Echogenicity within normal limits. No mass or hydronephrosis visualized. Bladder: Normal appearance of the bladder. There is a left ureter jet. Right ureter jet not identified. RENAL DUPLEX ULTRASOUND Right Renal Artery Velocities: Origin:  104 cm/sec Mid:  92 cm/sec Hilum:  46 cm/sec Interlobar:  39 cm/sec Arcuate:  15 cm/sec Left Renal Artery Velocities: Origin:  87 cm/sec Mid:  164 cm/sec Hilum:  145 cm/sec Interlobar:  57 cm/sec Arcuate:  21 cm/sec Aortic Velocity:  135 cm/sec Right Renal-Aortic Ratios: Origin: 0.8 Mid:  0.7 Hilum: 0.3 Interlobar: 0.3 Arcuate: 0.1 Left Renal-Aortic Ratios: Origin: 0.6 Mid: 1.2 Hilum: 1.1 Interlobar: 0.4 Arcuate: 0.2 Bilateral renal veins are patent. Velocity measurements in the renal arteries are within normal limits and no evidence for renal artery stenosis. IMPRESSION: No evidence for renal artery stenosis. Persistent moderate right hydronephrosis and no evidence for a right ureter jet in the bladder. Right hydronephrosis is similar to the recent CT and findings raise concern  for a persistent right ureter obstruction or a stricture. Right renal atrophy. Electronically Signed   By: Richarda Overlie M.D.   On: 03/30/2018 11:23    Assessment & Plan:   There are no diagnoses linked to this encounter.   No orders of the defined types were placed in this encounter.    Follow-up: No follow-ups on file.  Sonda Primes, MD

## 2018-05-25 NOTE — Assessment & Plan Note (Signed)
Labs CT card calcium scoring - may not be covered Vascepa if covered

## 2018-05-25 NOTE — Assessment & Plan Note (Signed)
Resolved S/p R stent F/u w/Urology

## 2018-05-29 ENCOUNTER — Other Ambulatory Visit: Payer: Self-pay | Admitting: Internal Medicine

## 2018-05-29 DIAGNOSIS — M25551 Pain in right hip: Secondary | ICD-10-CM | POA: Diagnosis not present

## 2018-05-29 DIAGNOSIS — M545 Low back pain: Secondary | ICD-10-CM | POA: Diagnosis not present

## 2018-05-29 DIAGNOSIS — G5602 Carpal tunnel syndrome, left upper limb: Secondary | ICD-10-CM | POA: Diagnosis not present

## 2018-05-29 DIAGNOSIS — G5601 Carpal tunnel syndrome, right upper limb: Secondary | ICD-10-CM | POA: Diagnosis not present

## 2018-05-29 MED ORDER — POTASSIUM CHLORIDE ER 8 MEQ PO TBCR: 8.0000 meq | EXTENDED_RELEASE_TABLET | Freq: Every day | ORAL | 3 refills | Status: DC

## 2018-05-30 DIAGNOSIS — N1330 Unspecified hydronephrosis: Secondary | ICD-10-CM | POA: Diagnosis not present

## 2018-06-05 DIAGNOSIS — G5601 Carpal tunnel syndrome, right upper limb: Secondary | ICD-10-CM | POA: Diagnosis not present

## 2018-06-05 DIAGNOSIS — M545 Low back pain: Secondary | ICD-10-CM | POA: Diagnosis not present

## 2018-06-05 DIAGNOSIS — M25551 Pain in right hip: Secondary | ICD-10-CM | POA: Diagnosis not present

## 2018-06-05 DIAGNOSIS — G5602 Carpal tunnel syndrome, left upper limb: Secondary | ICD-10-CM | POA: Diagnosis not present

## 2018-06-07 ENCOUNTER — Ambulatory Visit: Payer: BLUE CROSS/BLUE SHIELD | Admitting: Internal Medicine

## 2018-06-08 ENCOUNTER — Ambulatory Visit (INDEPENDENT_AMBULATORY_CARE_PROVIDER_SITE_OTHER)
Admission: RE | Admit: 2018-06-08 | Discharge: 2018-06-08 | Disposition: A | Discharge status: Home / Self Care | Payer: Self-pay | Source: Ambulatory Visit | Attending: Internal Medicine | Admitting: Internal Medicine

## 2018-06-08 DIAGNOSIS — E785 Hyperlipidemia, unspecified: Secondary | ICD-10-CM

## 2018-06-08 DIAGNOSIS — I10 Essential (primary) hypertension: Secondary | ICD-10-CM

## 2018-06-13 ENCOUNTER — Other Ambulatory Visit: Payer: Self-pay | Admitting: Family

## 2018-06-13 MED ORDER — HYDROCHLOROTHIAZIDE 25 MG PO TABS: 25.0000 mg | ORAL_TABLET | Freq: Every day | ORAL | 0 refills | Status: DC

## 2018-06-19 DIAGNOSIS — M545 Low back pain: Secondary | ICD-10-CM | POA: Diagnosis not present

## 2018-06-19 DIAGNOSIS — M25551 Pain in right hip: Secondary | ICD-10-CM | POA: Diagnosis not present

## 2018-06-19 DIAGNOSIS — G5602 Carpal tunnel syndrome, left upper limb: Secondary | ICD-10-CM | POA: Diagnosis not present

## 2018-06-19 DIAGNOSIS — G5601 Carpal tunnel syndrome, right upper limb: Secondary | ICD-10-CM | POA: Diagnosis not present

## 2018-06-23 DIAGNOSIS — H11122 Conjunctival concretions, left eye: Secondary | ICD-10-CM | POA: Diagnosis not present

## 2018-06-27 ENCOUNTER — Encounter: Payer: Self-pay | Admitting: Internal Medicine

## 2018-06-29 DIAGNOSIS — M545 Low back pain: Secondary | ICD-10-CM | POA: Diagnosis not present

## 2018-06-29 DIAGNOSIS — G5602 Carpal tunnel syndrome, left upper limb: Secondary | ICD-10-CM | POA: Diagnosis not present

## 2018-06-29 DIAGNOSIS — G5601 Carpal tunnel syndrome, right upper limb: Secondary | ICD-10-CM | POA: Diagnosis not present

## 2018-06-29 DIAGNOSIS — M25551 Pain in right hip: Secondary | ICD-10-CM | POA: Diagnosis not present

## 2018-07-07 ENCOUNTER — Telehealth: Payer: Self-pay | Admitting: Internal Medicine

## 2018-07-07 DIAGNOSIS — G5601 Carpal tunnel syndrome, right upper limb: Secondary | ICD-10-CM | POA: Diagnosis not present

## 2018-07-07 DIAGNOSIS — M545 Low back pain: Secondary | ICD-10-CM | POA: Diagnosis not present

## 2018-07-07 DIAGNOSIS — M25551 Pain in right hip: Secondary | ICD-10-CM | POA: Diagnosis not present

## 2018-07-07 DIAGNOSIS — G5602 Carpal tunnel syndrome, left upper limb: Secondary | ICD-10-CM | POA: Diagnosis not present

## 2018-07-07 NOTE — Telephone Encounter (Signed)
Left message for pt to call back  °

## 2018-07-10 NOTE — Telephone Encounter (Signed)
Patient did not return call.

## 2018-07-11 ENCOUNTER — Ambulatory Visit (AMBULATORY_SURGERY_CENTER): Payer: BLUE CROSS/BLUE SHIELD | Admitting: Internal Medicine

## 2018-07-11 ENCOUNTER — Encounter: Payer: Self-pay | Admitting: Internal Medicine

## 2018-07-11 VITALS — BP 128/74 | HR 59 | Temp 98.0°F | Resp 14 | Ht 61.0 in | Wt 149.0 lb

## 2018-07-11 DIAGNOSIS — K209 Esophagitis, unspecified: Secondary | ICD-10-CM | POA: Diagnosis not present

## 2018-07-11 DIAGNOSIS — K449 Diaphragmatic hernia without obstruction or gangrene: Secondary | ICD-10-CM | POA: Diagnosis not present

## 2018-07-11 DIAGNOSIS — K219 Gastro-esophageal reflux disease without esophagitis: Secondary | ICD-10-CM | POA: Diagnosis not present

## 2018-07-11 MED ORDER — SODIUM CHLORIDE 0.9 % IV SOLN: 500.0000 mL | Freq: Once | INTRAVENOUS | Status: DC

## 2018-07-11 NOTE — Patient Instructions (Signed)
   CONTINUE OMEPRAZOLE 40 MG DAILY  FOLLOW UP WITH DR [name] WITH AN OFFICE APPOINTMENT IN ONE YEAR   YOU HAD AN ENDOSCOPIC PROCEDURE TODAY AT THE [facility]:   Refer to the procedure report that was given to you for any specific questions about what was found during the examination.  If the procedure report does not answer your questions, please call your gastroenterologist to clarify.  If you requested that your care partner not be given the details of your procedure findings, then the procedure report has been included in a sealed envelope for you to review at your convenience later.  YOU SHOULD EXPECT: Some feelings of bloating in the abdomen. Passage of more gas than usual.  Walking can help get rid of the air that was put into your GI tract during the procedure and reduce the bloating. If you had a lower endoscopy (such as a colonoscopy or flexible sigmoidoscopy) you may notice spotting of blood in your stool or on the toilet paper. If you underwent a bowel prep for your procedure, you may not have a normal bowel movement for a few days.  Please Note:  You might notice some irritation and congestion in your nose or some drainage.  This is from the oxygen used during your procedure.  There is no need for concern and it should clear up in a day or so.  SYMPTOMS TO REPORT IMMEDIATELY:     Following upper endoscopy (EGD)  Vomiting of blood or coffee ground material  New chest pain or pain under the shoulder blades  Painful or persistently difficult swallowing  New shortness of breath  Fever of 100F or higher  Black, tarry-looking stools  For urgent or emergent issues, a gastroenterologist can be reached at any hour by calling [phone].   DIET:  We do recommend a small meal at first, but then you may proceed to your regular diet.  Drink plenty of fluids but you should avoid alcoholic beverages for 24 hours.  ACTIVITY:  You should plan to take it easy for the rest  of today and you should NOT DRIVE or use heavy machinery until tomorrow (because of the sedation medicines used during the test).    FOLLOW UP: Our staff will call the number listed on your records the next business day following your procedure to check on you and address any questions or concerns that you may have regarding the information given to you following your procedure. If we do not reach you, we will leave a message.  However, if you are feeling well and you are not experiencing any problems, there is no need to return our call.  We will assume that you have returned to your regular daily activities without incident.  If any biopsies were taken you will be contacted by phone or by letter within the next 1-3 weeks.  Please call us at [phone] if you have not heard about the biopsies in 3 weeks.    SIGNATURES/CONFIDENTIALITY: You and/or your care partner have signed paperwork which will be entered into your electronic medical record.  These signatures attest to the fact that that the information above on your After Visit Summary has been reviewed and is understood.  Full responsibility of the confidentiality of this discharge information lies with you and/or your care-partner.

## 2018-07-11 NOTE — Progress Notes (Signed)
To PACU, VSS. Report to [email or web address] 

## 2018-07-11 NOTE — Progress Notes (Signed)
Pt's states no medical or surgical changes since previsit or office visit. 

## 2018-07-11 NOTE — Op Note (Signed)
[facility] Patient Name: Cheryl Jones Procedure Date: 07/11/2018 3:43 PM MRN: [id number] Endoscopist: Beverley Fiedler , MD Age: 57 Referring MD:  Date of Birth: 16-Feb-1961 Gender: Female Account #: [id number] Procedure:                Upper GI endoscopy Indications:              Gastro-esophageal reflux disease, history of                            esophagitis and large hiatal hernia Medicines:                Monitored Anesthesia Care Procedure:                Pre-Anesthesia Assessment:                           - Prior to the procedure, a History and Physical                            was performed, and patient medications and                            allergies were reviewed. The patient's tolerance of                            previous anesthesia was also reviewed. The risks                            and benefits of the procedure and the sedation                            options and risks were discussed with the patient.                            All questions were answered, and informed consent                            was obtained. Prior Anticoagulants: The patient has                            taken no previous anticoagulant or antiplatelet                            agents. ASA Grade Assessment: II - A patient with                            mild systemic disease. After reviewing the risks                            and benefits, the patient was deemed in                            satisfactory condition to undergo the procedure.  After obtaining informed consent, the endoscope was                            passed under direct vision. Throughout the                            procedure, the patient's blood pressure, pulse, and                            oxygen saturations were monitored continuously. The                            Endoscope was introduced through the mouth, and                            advanced to the second part  of duodenum. The upper                            GI endoscopy was accomplished without difficulty.                            The patient tolerated the procedure well. Scope In: Scope Out: Findings:                 Normal mucosa was found in the entire esophagus.                           A large hiatal hernia was found. The proximal                            extent of the gastric folds (end of tubular                            esophagus) was 28 cm from the incisors. The hiatal                            narrowing was 38 cm from the incisors. The Z-line                            was 28 cm from the incisors. I do not see Barrett's                            esophagus. The previously seen esophagitis has                            healed completely.                           The entire examined stomach was normal.                           The examined duodenum was normal. Complications:            No immediate complications. Estimated Blood Loss:     Estimated  blood loss: none. Impression:               - Normal mucosa was found in the entire esophagus.                           - Large hiatal hernia.                           - Normal stomach.                           - Normal examined duodenum.                           - No specimens collected. Recommendation:           - Patient has a contact number available for                            emergencies. The signs and symptoms of potential                            delayed complications were discussed with the                            patient. Return to normal activities tomorrow.                            Written discharge instructions were provided to the                            patient.                           - Resume previous diet.                           - Continue present medications. Given previous                            esophagitis and large hiatal hernia I recommend for                            you to  continue omeprazole at 40 mg once daily.                           - Office follow-up in about 1 year. Beverley Fiedler, MD 07/11/2018 3:58:05 PM This report has been signed electronically.

## 2018-07-12 ENCOUNTER — Telehealth: Payer: Self-pay

## 2018-07-12 NOTE — Telephone Encounter (Signed)
  Follow up Call-  Call back number 07/11/2018 03/11/2016  Post procedure Call Back phone  # [phone] cell  Permission to leave phone message Yes Yes  Some recent data might be hidden     Patient questions:  Do you have a fever, pain , or abdominal swelling? No. Pain Score  0 *  Have you tolerated food without any problems? Yes.    Have you been able to return to your normal activities? Yes.    Do you have any questions about your discharge instructions: Diet   No. Medications  No. Follow up visit  No.  Do you have questions or concerns about your Care? No.  Actions: * If pain score is 4 or above: No action needed, pain <4.

## 2018-07-13 DIAGNOSIS — G5601 Carpal tunnel syndrome, right upper limb: Secondary | ICD-10-CM | POA: Diagnosis not present

## 2018-07-13 DIAGNOSIS — M545 Low back pain: Secondary | ICD-10-CM | POA: Diagnosis not present

## 2018-07-13 DIAGNOSIS — M25551 Pain in right hip: Secondary | ICD-10-CM | POA: Diagnosis not present

## 2018-07-13 DIAGNOSIS — G5602 Carpal tunnel syndrome, left upper limb: Secondary | ICD-10-CM | POA: Diagnosis not present

## 2018-07-20 DIAGNOSIS — M545 Low back pain: Secondary | ICD-10-CM | POA: Diagnosis not present

## 2018-07-20 DIAGNOSIS — G5601 Carpal tunnel syndrome, right upper limb: Secondary | ICD-10-CM | POA: Diagnosis not present

## 2018-07-20 DIAGNOSIS — G5602 Carpal tunnel syndrome, left upper limb: Secondary | ICD-10-CM | POA: Diagnosis not present

## 2018-07-20 DIAGNOSIS — M25551 Pain in right hip: Secondary | ICD-10-CM | POA: Diagnosis not present

## 2018-07-27 DIAGNOSIS — M545 Low back pain: Secondary | ICD-10-CM | POA: Diagnosis not present

## 2018-07-27 DIAGNOSIS — G5601 Carpal tunnel syndrome, right upper limb: Secondary | ICD-10-CM | POA: Diagnosis not present

## 2018-07-27 DIAGNOSIS — G5602 Carpal tunnel syndrome, left upper limb: Secondary | ICD-10-CM | POA: Diagnosis not present

## 2018-07-27 DIAGNOSIS — M25551 Pain in right hip: Secondary | ICD-10-CM | POA: Diagnosis not present

## 2018-08-03 DIAGNOSIS — G5602 Carpal tunnel syndrome, left upper limb: Secondary | ICD-10-CM | POA: Diagnosis not present

## 2018-08-03 DIAGNOSIS — M545 Low back pain: Secondary | ICD-10-CM | POA: Diagnosis not present

## 2018-08-03 DIAGNOSIS — M25551 Pain in right hip: Secondary | ICD-10-CM | POA: Diagnosis not present

## 2018-08-03 DIAGNOSIS — G5601 Carpal tunnel syndrome, right upper limb: Secondary | ICD-10-CM | POA: Diagnosis not present

## 2018-08-16 DIAGNOSIS — G5602 Carpal tunnel syndrome, left upper limb: Secondary | ICD-10-CM | POA: Diagnosis not present

## 2018-08-16 DIAGNOSIS — M545 Low back pain: Secondary | ICD-10-CM | POA: Diagnosis not present

## 2018-08-16 DIAGNOSIS — G5601 Carpal tunnel syndrome, right upper limb: Secondary | ICD-10-CM | POA: Diagnosis not present

## 2018-08-16 DIAGNOSIS — M25551 Pain in right hip: Secondary | ICD-10-CM | POA: Diagnosis not present

## 2018-08-22 DIAGNOSIS — G5602 Carpal tunnel syndrome, left upper limb: Secondary | ICD-10-CM | POA: Diagnosis not present

## 2018-08-22 DIAGNOSIS — M25551 Pain in right hip: Secondary | ICD-10-CM | POA: Diagnosis not present

## 2018-08-22 DIAGNOSIS — M545 Low back pain: Secondary | ICD-10-CM | POA: Diagnosis not present

## 2018-08-22 DIAGNOSIS — G5601 Carpal tunnel syndrome, right upper limb: Secondary | ICD-10-CM | POA: Diagnosis not present

## 2018-08-23 DIAGNOSIS — H35033 Hypertensive retinopathy, bilateral: Secondary | ICD-10-CM | POA: Diagnosis not present

## 2018-08-25 ENCOUNTER — Other Ambulatory Visit: Payer: Self-pay | Admitting: Internal Medicine

## 2018-08-30 DIAGNOSIS — M25551 Pain in right hip: Secondary | ICD-10-CM | POA: Diagnosis not present

## 2018-08-30 DIAGNOSIS — M545 Low back pain: Secondary | ICD-10-CM | POA: Diagnosis not present

## 2018-08-30 DIAGNOSIS — G5601 Carpal tunnel syndrome, right upper limb: Secondary | ICD-10-CM | POA: Diagnosis not present

## 2018-08-30 DIAGNOSIS — G5602 Carpal tunnel syndrome, left upper limb: Secondary | ICD-10-CM | POA: Diagnosis not present

## 2018-09-01 DIAGNOSIS — N130 Hydronephrosis with ureteropelvic junction obstruction: Secondary | ICD-10-CM | POA: Diagnosis not present

## 2018-09-04 NOTE — Telephone Encounter (Signed)
Please advise about what labs you would like entered

## 2018-09-05 ENCOUNTER — Other Ambulatory Visit: Payer: Self-pay | Admitting: Internal Medicine

## 2018-09-05 DIAGNOSIS — E785 Hyperlipidemia, unspecified: Secondary | ICD-10-CM

## 2018-09-05 DIAGNOSIS — M545 Low back pain: Secondary | ICD-10-CM | POA: Diagnosis not present

## 2018-09-05 DIAGNOSIS — G5601 Carpal tunnel syndrome, right upper limb: Secondary | ICD-10-CM | POA: Diagnosis not present

## 2018-09-05 DIAGNOSIS — G5602 Carpal tunnel syndrome, left upper limb: Secondary | ICD-10-CM | POA: Diagnosis not present

## 2018-09-05 DIAGNOSIS — M25551 Pain in right hip: Secondary | ICD-10-CM | POA: Diagnosis not present

## 2018-10-02 ENCOUNTER — Ambulatory Visit: Payer: Self-pay

## 2018-10-02 NOTE — Telephone Encounter (Signed)
Pt called c/o oblong shaped rash. Pt stated that the rash is 2 1/2 in length and 1 inch in width. The rash is located right upper thigh below right hip. It is 1 spot only. Pt stated that the center of the rash has blisters noted. Pt has used antiseptic cleanser and antibiotic cream. Pt recently returned from a trip to [place].  Pt denies itching but stated it burns. Care advice given and pt verbalized understanding. Appt given for tomorrow morning with Ria Clock NP.  Reason for Disposition . Pus is draining from the rash    Blisters noted in area affected.  Answer Assessment - Initial Assessment Questions 1. APPEARANCE of RASH: "Describe the rash."     Oblong in shape 2 1/2 inches length- and 1 inch in width 2. LOCATION: "Where is the rash located?"      Right thigh below right hip 3. NUMBER: "How many spots are there?"      1 4. SIZE: "How big are the spots?" (Inches, centimeters or compare to size of a coin)      See ablove 5. ONSET: "When did the rash start?"      Noted it this morning 6. ITCHING: "Does the rash itch?" If so, ask: "How bad is the itch?"  (Scale 1-10; or mild, moderate, severe)     No more of a burning sensation 7. PAIN: "Does the rash hurt?" If so, ask: "How bad is the pain?"  (Scale 1-10; or mild, moderate, severe)     burning 8. OTHER SYMPTOMS: "Do you have any other symptoms?" (e.g., fever)     no 9. PREGNANCY: "Is there any chance you are pregnant?" "When was your last menstrual period?"     n/a  Protocols used: RINGWORM-A-AH, RASH OR REDNESS - LOCALIZED-A-AH

## 2018-10-03 ENCOUNTER — Other Ambulatory Visit: Payer: BLUE CROSS/BLUE SHIELD

## 2018-10-03 ENCOUNTER — Encounter: Payer: Self-pay | Admitting: Family

## 2018-10-03 ENCOUNTER — Ambulatory Visit (INDEPENDENT_AMBULATORY_CARE_PROVIDER_SITE_OTHER): Payer: BLUE CROSS/BLUE SHIELD | Admitting: Family

## 2018-10-03 ENCOUNTER — Other Ambulatory Visit (INDEPENDENT_AMBULATORY_CARE_PROVIDER_SITE_OTHER): Payer: BLUE CROSS/BLUE SHIELD

## 2018-10-03 VITALS — BP 144/80 | HR 65 | Temp 98.9°F | Ht 61.0 in | Wt 150.0 lb

## 2018-10-03 DIAGNOSIS — R21 Rash and other nonspecific skin eruption: Secondary | ICD-10-CM | POA: Diagnosis not present

## 2018-10-03 DIAGNOSIS — E785 Hyperlipidemia, unspecified: Secondary | ICD-10-CM

## 2018-10-03 DIAGNOSIS — I10 Essential (primary) hypertension: Secondary | ICD-10-CM | POA: Diagnosis not present

## 2018-10-03 LAB — BASIC METABOLIC PANEL
BUN: 22 mg/dL (ref 6–23)
CO2: 29 meq/L (ref 19–32)
Calcium: 9.7 mg/dL (ref 8.4–10.5)
Chloride: 103 mEq/L (ref 96–112)
Creatinine, Ser: 1.27 mg/dL — ABNORMAL HIGH (ref 0.40–1.20)
GFR: 46.16 mL/min — ABNORMAL LOW (ref >60.00)
GLUCOSE: 97 mg/dL (ref 70–99)
POTASSIUM: 3.9 meq/L (ref 3.5–5.1)
SODIUM: 140 meq/L (ref 135–145)

## 2018-10-03 LAB — LIPID PANEL
CHOL/HDL RATIO: 5
Cholesterol: 286 mg/dL — ABNORMAL HIGH (ref 0–200)
HDL: 54.8 mg/dL (ref >39.00)
LDL CALC: 201 mg/dL — AB (ref 0–99)
NONHDL: 230.84
TRIGLYCERIDES: 151 mg/dL — AB (ref 0.0–149.0)
VLDL: 30.2 mg/dL (ref 0.0–40.0)

## 2018-10-03 MED ORDER — DOXYCYCLINE HYCLATE 100 MG PO TABS: 100.0000 mg | ORAL_TABLET | Freq: Two times a day (BID) | ORAL | 0 refills | Status: DC

## 2018-10-03 MED ORDER — HYDROCHLOROTHIAZIDE 25 MG PO TABS: 25.0000 mg | ORAL_TABLET | Freq: Every day | ORAL | 0 refills | Status: DC

## 2018-10-03 NOTE — Progress Notes (Signed)
Cheryl Jones is a 57 y.o. female with the following history as recorded in Epic[employer]:  Patient Active Problem List   Diagnosis Date Noted  . Dyslipidemia 05/25/2018  . Renal calculus 04/05/2018  . Hydronephrosis of right kidney 04/05/2018  . Diarrhea 01/02/2018  . Elevated hemoglobin (HCC) 05/09/2017  . Hypokalemia 05/09/2017  . Abdominal pain 06/11/2016  . Gastroesophageal reflux disease with esophagitis   . Hiatal hernia   . Asthmatic bronchitis 10/24/2015  . H/O gestational diabetes mellitus, not currently pregnant 08/08/2015  . Proteinuria 05/10/2015  . Urinary tract infectious disease 05/10/2015  . Well adult exam 04/20/2013  . Dizziness 01/11/2012  . Hyperglycemia 01/11/2012  . Hypertension 06/07/2011  . Seromucinous otitis media 06/07/2011  . Arthralgia 02/23/2011  . ELEVATED BLOOD PRESSURE 02/23/2011  . FASCIITIS, PLANTAR 03/21/2009  . CARPAL TUNNEL SYNDROME 09/11/2007    Current Outpatient Medications  Medication Sig Dispense Refill  . calcium-vitamin D 250-100 MG-UNIT tablet Take 1 tablet by mouth daily.    . Cholecalciferol (VITAMIN D PO) Take 1 tablet by mouth daily.    . hydrochlorothiazide (HYDRODIURIL) 25 MG tablet Take 1 tablet (25 mg total) by mouth daily. 90 tablet 0  . KLOR-CON 8 MEQ tablet TAKE 1 TABLET (8 MEQ TOTAL) BY MOUTH DAILY. 90 tablet 0  . loratadine (CLARITIN) 10 MG tablet Take 10 mg by mouth daily.      Marland Kitchen MAGNESIUM PO Take 150 mg by mouth daily.    . nebivolol (BYSTOLIC) 10 MG tablet Take 1 tablet (10 mg total) by mouth daily. 30 tablet 11  . Nutritional Supplements (JUICE PLUS FIBRE PO) Take 2 tablets by mouth daily.    Marland Kitchen omeprazole (PRILOSEC) 40 MG capsule Take 1 capsule (40 mg total) by mouth daily. 90 capsule 1  . PAZEO 0.7 % SOLN     . Probiotic Product (PROBIOTIC DAILY PO) Take by mouth daily.    Marland Kitchen doxycycline (VIBRA-TABS) 100 MG tablet Take 1 tablet (100 mg total) by mouth 2 (two) times daily. 20 tablet 0  . Icosapent Ethyl (VASCEPA) 1 g  CAPS Take 2 capsules (2 g total) by mouth 2 (two) times daily. (Patient not taking: Reported on 10/03/2018) 120 capsule 11   No current facility-administered medications for this visit.     Allergies: Amlodipine  Past Medical History:  Diagnosis Date  . Diverticulosis   . Erosive esophagitis   . GERD (gastroesophageal reflux disease)   . Hiatal hernia   . Hypertension   . Kidney stone   . Plantar fasciitis     Past Surgical History:  Procedure Laterality Date  . DILATION AND CURETTAGE OF UTERUS    . ESOPHAGEAL MANOMETRY N/A 03/29/2016   Procedure: ESOPHAGEAL MANOMETRY (EM);  Surgeon: Meryl Dare, MD;  Location: [facility];  Service: Endoscopy;  Laterality: N/A;  . HERNIA REPAIR    . INGUINAL HERNIA REPAIR  2002   right  . WISDOM TOOTH EXTRACTION  01/20/2017    Family History  Problem Relation Age of Onset  . Colon cancer Paternal Uncle 72  . Hyperlipidemia Mother   . Hypertension Mother   . Hyperlipidemia Father   . Hypertension Father   . Hypertension Other   . Coronary artery disease Neg Hx   . Esophageal cancer Neg Hx   . Rectal cancer Neg Hx   . Stomach cancer Neg Hx     Social History   Tobacco Use  . Smoking status: Never Smoker  . Smokeless tobacco: Never Used  Substance  Use Topics  . Alcohol use: Yes    Alcohol/week: 3.0 standard drinks    Types: 3 Glasses of wine per week    Subjective:  Patient presents with concerns for 1 day on right thigh; "burning with blisters"; recently returned from [place];  Also mentions "stye" of left eyelid; Needs refill on HCTZ; needs to get her labs re-checked- not currently taking Vascepa;    Objective:  Vitals:   10/03/18 0919  BP: (!) 144/80  Pulse: 65  Temp: 98.9 F (37.2 C)  TempSrc: Oral  SpO2: 98%  Weight: 150 lb (68 kg)  Height: 5\' 1"  (1.549 m)    General: Well developed, well nourished, in no acute distress  Skin : Warm and dry. Upper right thigh- localized area of erythema with 2 fluid filled  blisters; Head: Normocephalic and atraumatic  Eyes: Sclera and conjunctiva clear; pupils round and reactive to light; extraocular movements intact  Ears: External normal; canals clear; tympanic membranes normal  Oropharynx: Pink, supple. No suspicious lesions  Neck: Supple without thyromegaly, adenopathy  Lungs: Respirations unlabored; clear to auscultation bilaterally without wheeze, rales, rhonchi  CVS exam: normal rate and regular rhythm.  Neurologic: Alert and oriented; speech intact; face symmetrical; moves all extremities well; CNII-XII intact without focal deficit   Assessment:  1. Rash   2. Essential hypertension   3. Dyslipidemia     Plan:  Check viral and bacterial culture; start Doxycycline 100 mg bid x 10 days- symptoms more consistent with staph infection; less likely to be shingles; follow-up to be determined. Refill updated on HCTZ as requested; patient is to start checking blood pressure regularly- may be able to stop one of her medications; update labs as requested by her PCP;  No follow-ups on file.  Orders Placed This Encounter  Procedures  . WOUND CULTURE    Standing Status:   Future    Number of Occurrences:   1    Standing Expiration Date:   10/04/2019    Order Specific Question:   Source    Answer:   right thigh/ body fluid from blister  . Viral culture    Standing Status:   Future    Number of Occurrences:   1    Standing Expiration Date:   10/04/2019    Requested Prescriptions   Signed Prescriptions Disp Refills  . hydrochlorothiazide (HYDRODIURIL) 25 MG tablet 90 tablet 0    Sig: Take 1 tablet (25 mg total) by mouth daily.  Marland Kitchen doxycycline (VIBRA-TABS) 100 MG tablet 20 tablet 0    Sig: Take 1 tablet (100 mg total) by mouth 2 (two) times daily.

## 2018-10-05 ENCOUNTER — Telehealth: Payer: Self-pay | Admitting: Internal Medicine

## 2018-10-05 NOTE — Telephone Encounter (Signed)
Copied from CRM [id number]. Topic: Quick Communication - See Telephone Encounter >> Oct 05, 2018  4:26 PM Stephannie Li, NT wrote: CRM for notification. See Telephone encounter for: 10/05/18. Patient called and would like a return call with the  results  from a culture that was done on Tuesday and, she also says the antibiotic seems to be working for now  [phone]

## 2018-10-06 DIAGNOSIS — G5601 Carpal tunnel syndrome, right upper limb: Secondary | ICD-10-CM | POA: Diagnosis not present

## 2018-10-06 DIAGNOSIS — M25551 Pain in right hip: Secondary | ICD-10-CM | POA: Diagnosis not present

## 2018-10-06 DIAGNOSIS — M545 Low back pain: Secondary | ICD-10-CM | POA: Diagnosis not present

## 2018-10-06 DIAGNOSIS — G5602 Carpal tunnel syndrome, left upper limb: Secondary | ICD-10-CM | POA: Diagnosis not present

## 2018-10-06 LAB — WOUND CULTURE
MICRO NUMBER: 91268601
RESULT:: NO GROWTH
SPECIMEN QUALITY:: ADEQUATE

## 2018-10-06 LAB — VIRAL CULTURE VIRC

## 2018-10-11 DIAGNOSIS — M25551 Pain in right hip: Secondary | ICD-10-CM | POA: Diagnosis not present

## 2018-10-11 DIAGNOSIS — M545 Low back pain: Secondary | ICD-10-CM | POA: Diagnosis not present

## 2018-10-11 DIAGNOSIS — G5601 Carpal tunnel syndrome, right upper limb: Secondary | ICD-10-CM | POA: Diagnosis not present

## 2018-10-11 DIAGNOSIS — G5602 Carpal tunnel syndrome, left upper limb: Secondary | ICD-10-CM | POA: Diagnosis not present

## 2018-10-18 DIAGNOSIS — M25551 Pain in right hip: Secondary | ICD-10-CM | POA: Diagnosis not present

## 2018-10-18 DIAGNOSIS — G5601 Carpal tunnel syndrome, right upper limb: Secondary | ICD-10-CM | POA: Diagnosis not present

## 2018-10-18 DIAGNOSIS — G5602 Carpal tunnel syndrome, left upper limb: Secondary | ICD-10-CM | POA: Diagnosis not present

## 2018-10-18 DIAGNOSIS — M545 Low back pain: Secondary | ICD-10-CM | POA: Diagnosis not present

## 2018-10-23 ENCOUNTER — Ambulatory Visit (INDEPENDENT_AMBULATORY_CARE_PROVIDER_SITE_OTHER): Payer: BLUE CROSS/BLUE SHIELD | Admitting: Internal Medicine

## 2018-10-23 ENCOUNTER — Encounter: Payer: Self-pay | Admitting: Internal Medicine

## 2018-10-23 VITALS — BP 132/82 | HR 59 | Temp 97.6°F | Ht 61.0 in | Wt 153.0 lb

## 2018-10-23 DIAGNOSIS — E785 Hyperlipidemia, unspecified: Secondary | ICD-10-CM

## 2018-10-23 DIAGNOSIS — Z23 Encounter for immunization: Secondary | ICD-10-CM | POA: Diagnosis not present

## 2018-10-23 DIAGNOSIS — R7989 Other specified abnormal findings of blood chemistry: Secondary | ICD-10-CM | POA: Diagnosis not present

## 2018-10-23 DIAGNOSIS — I10 Essential (primary) hypertension: Secondary | ICD-10-CM | POA: Diagnosis not present

## 2018-10-23 NOTE — Assessment & Plan Note (Signed)
Monitor q 3-4 months

## 2018-10-23 NOTE — Assessment & Plan Note (Addendum)
CT card calcium scoring "0"  2019  Vascepa

## 2018-10-23 NOTE — Assessment & Plan Note (Signed)
Gluten free diet 

## 2018-10-23 NOTE — Assessment & Plan Note (Signed)
BP Readings from Last 3 Encounters:  10/23/18 132/82  10/03/18 (!) 144/80  07/11/18 128/74

## 2018-10-23 NOTE — Progress Notes (Signed)
Subjective:  Patient ID: Cheryl Jones, female    DOB: 28-Aug-1961  Age: 57 y.o. MRN: [id number]  CC: No chief complaint on file.   HPI Rayel Gail presents for HTN, rash, dyslipidemia f/u   Outpatient Medications Prior to Visit  Medication Sig Dispense Refill  . calcium-vitamin D 250-100 MG-UNIT tablet Take 1 tablet by mouth daily.    . Cholecalciferol (VITAMIN D PO) Take 1 tablet by mouth daily.    Marland Kitchen doxycycline (VIBRA-TABS) 100 MG tablet Take 1 tablet (100 mg total) by mouth 2 (two) times daily. 20 tablet 0  . hydrochlorothiazide (HYDRODIURIL) 25 MG tablet Take 1 tablet (25 mg total) by mouth daily. 90 tablet 0  . Icosapent Ethyl (VASCEPA) 1 g CAPS Take 2 capsules (2 g total) by mouth 2 (two) times daily. 120 capsule 11  . KLOR-CON 8 MEQ tablet TAKE 1 TABLET (8 MEQ TOTAL) BY MOUTH DAILY. 90 tablet 0  . loratadine (CLARITIN) 10 MG tablet Take 10 mg by mouth daily.      Marland Kitchen MAGNESIUM PO Take 150 mg by mouth daily.    . nebivolol (BYSTOLIC) 10 MG tablet Take 1 tablet (10 mg total) by mouth daily. 30 tablet 11  . Nutritional Supplements (JUICE PLUS FIBRE PO) Take 2 tablets by mouth daily.    Marland Kitchen omeprazole (PRILOSEC) 40 MG capsule Take 1 capsule (40 mg total) by mouth daily. 90 capsule 1  . PAZEO 0.7 % SOLN     . Probiotic Product (PROBIOTIC DAILY PO) Take by mouth daily.     No facility-administered medications prior to visit.     ROS: Review of Systems  Constitutional: Negative for activity change, appetite change, chills, fatigue and unexpected weight change.  HENT: Negative for congestion, mouth sores and sinus pressure.   Eyes: Negative for visual disturbance.  Respiratory: Negative for cough and chest tightness.   Gastrointestinal: Negative for abdominal pain and nausea.  Genitourinary: Negative for difficulty urinating, frequency and vaginal pain.  Musculoskeletal: Negative for back pain and gait problem.  Skin: Positive for rash. Negative for pallor.  Neurological:  Negative for dizziness, tremors, weakness, numbness and headaches.  Psychiatric/Behavioral: Negative for confusion, sleep disturbance and suicidal ideas.    Objective:  BP 132/82 (BP Location: Left Arm, Patient Position: Sitting, Cuff Size: Normal)   Pulse (!) 59   Temp 97.6 F (36.4 C) (Oral)   Ht 5\' 1"  (1.549 m)   Wt 153 lb (69.4 kg)   SpO2 98%   BMI 28.91 kg/m   BP Readings from Last 3 Encounters:  10/23/18 132/82  10/03/18 (!) 144/80  07/11/18 128/74    Wt Readings from Last 3 Encounters:  10/23/18 153 lb (69.4 kg)  10/03/18 150 lb (68 kg)  07/11/18 149 lb (67.6 kg)    Physical Exam  Constitutional: She appears well-developed. No distress.  HENT:  Head: Normocephalic.  Right Ear: External ear normal.  Left Ear: External ear normal.  Nose: Nose normal.  Mouth/Throat: Oropharynx is clear and moist.  Eyes: Pupils are equal, round, and reactive to light. Conjunctivae are normal. Right eye exhibits no discharge. Left eye exhibits no discharge.  Neck: Normal range of motion. Neck supple. No JVD present. No tracheal deviation present. No thyromegaly present.  Cardiovascular: Normal rate, regular rhythm and normal heart sounds.  Pulmonary/Chest: No stridor. No respiratory distress. She has no wheezes.  Abdominal: Soft. Bowel sounds are normal. She exhibits no distension and no mass. There is no tenderness. There is no rebound and  no guarding.  Musculoskeletal: She exhibits no edema or tenderness.  Lymphadenopathy:    She has no cervical adenopathy.  Neurological: She displays normal reflexes. No cranial nerve deficit. She exhibits normal muscle tone. Coordination normal.  Skin: No rash noted. No erythema.  Psychiatric: She has a normal mood and affect. Her behavior is normal. Judgment and thought content normal.    Lab Results  Component Value Date   WBC 6.9 04/05/2018   HGB 13.7 04/05/2018   HCT 39.5 04/05/2018   PLT 245.0 04/05/2018   GLUCOSE 97 10/03/2018   CHOL  286 (H) 10/03/2018   TRIG 151.0 (H) 10/03/2018   HDL 54.80 10/03/2018   LDLDIRECT 160.9 04/20/2013   LDLCALC 201 (H) 10/03/2018   ALT 11 01/18/2018   AST 12 01/18/2018   NA 140 10/03/2018   K 3.9 10/03/2018   CL 103 10/03/2018   CREATININE 1.27 (H) 10/03/2018   BUN 22 10/03/2018   CO2 29 10/03/2018   TSH 0.29 (L) 05/05/2017   HGBA1C 5.7 01/02/2018    Ct Cardiac Scoring  Addendum Date: 06/08/2018   ADDENDUM REPORT: 06/08/2018 14:52 ADDENDUM: OVER-READ INTERPRETATION  CT CHEST The following report is an over-read performed by radiologist Dr. Arliss Journey [facility] on 10/16/2013. This over-read does not include interpretation of cardiac or coronary anatomy or pathology. The calcium score interpretation by the cardiologist is attached. COMPARISON: 02/13/2004 FINDINGS: Vascular: Normal aortic caliber.  Mild descending aortic tortuosity. Mediastinum/Nodes: No imaged thoracic adenopathy. A large hiatal hernia, with greater than 1/2 of the stomach positioned in the chest. Lungs/Pleura: No pleural fluid. Left lower lobe subsegmental atelectasis with volume loss. Upper Abdomen: Normal imaged portions of the liver, spleen. Musculoskeletal: No acute osseous abnormality. IMPRESSION: 1.  No acute findings in the imaged extracardiac chest. 2. Large hiatal hernia. Electronically Signed   By: Jeronimo Greaves M.D.   On: 06/08/2018 14:52   Result Date: 06/08/2018 CLINICAL DATA:  Risk stratification EXAM: Coronary Calcium Score TECHNIQUE: The patient was scanned on a Siemens Somatom 64 slice scanner. Axial non-contrast 3mm slices were carried out through the heart. The data set was analyzed on a dedicated work station and scored using the Agatson method. FINDINGS: Non-cardiac: No significant non cardiac findings on limited lung and soft tissue windows. See separate report from [facility]. Ascending Aorta: Normal diameter 3.1 cm Pericardium: Normal Coronary arteries: No calcium detected  IMPRESSION: Coronary calcium score of 0. Large Hiatal Hernia Charlton Haws Electronically Signed: By: Charlton Haws M.D. On: 06/08/2018 14:13    Assessment & Plan:   There are no diagnoses linked to this encounter.   No orders of the defined types were placed in this encounter.    Follow-up: No follow-ups on file.  Sonda Primes, MD

## 2018-10-23 NOTE — Patient Instructions (Signed)
  Gluten free trial for 4-6 weeks. OK to use gluten-free bread and gluten-free pasta.    Gluten-Free Diet for Celiac Disease, Adult The gluten-free diet includes all foods that do not contain gluten. Gluten is a protein that is found in wheat, rye, barley, and some other grains. Following the gluten-free diet is the only treatment for people with celiac disease. It helps to prevent damage to the intestines and improves or eliminates the symptoms of celiac disease. Following the gluten-free diet requires some planning. It can be challenging at first, but it gets easier with time and practice. There are more gluten-free options available today than ever before. If you need help finding gluten-free foods or if you have questions, talk with your diet and nutrition specialist (registered dietitian) or your health care provider. What do I need to know about a gluten-free diet?  All fruits, vegetables, and meats are safe to eat and do not contain gluten.  When grocery shopping, start by shopping in the produce, meat, and dairy sections. These sections are more likely to contain gluten-free foods. Then move to the aisles that contain packaged foods if you need to.  Read all food labels. Gluten is often added to foods. Always check the ingredient list and look for warnings, such as "may contain gluten."  Talk with your dietitian or health care provider before taking a gluten-free multivitamin or mineral supplement.  Be aware of gluten-free foods having contact with foods that contain gluten (cross-contamination). This can happen at home and with any processed foods. ? Talk with your health care provider or dietitian about how to reduce the risk of cross-contamination in your home. ? If you have questions about how a food is processed, ask the manufacturer. What key words help to identify gluten? Foods that list any of these key words on the label usually contain gluten:  Wheat, flour, enriched  flour, bromated flour, white flour, durum flour, graham flour, phosphated flour, self-rising flour, semolina, farina, barley (malt), rye, and oats.  Starch, dextrin, modified food starch, or cereal.  Thickening, fillers, or emulsifiers.  Malt flavoring, malt extract, or malt syrup.  Hydrolyzed vegetable protein.  In the [place] packaged foods that are gluten-free are required to be labeled "GF." These foods should be easy to identify and are safe to eat. In the [place] food companies are also required to list common food allergens, including wheat, on their labels. Recommended foods Grains  Amaranth, bean flours, 100% buckwheat flour, corn, millet, nut flours or nut meals, GF oats, quinoa, rice, sorghum, teff, rice wafers, pure cornmeal tortillas, popcorn, and hot cereals made from cornmeal. Hominy, rice, wild rice. Some Asian rice noodles or bean noodles. Arrowroot starch, corn bran, corn flour, corn germ, cornmeal, corn starch, potato flour, potato starch flour, and rice bran. Plain, brown, and sweet rice flours. Rice polish, soy flour, and tapioca starch. Vegetables  All plain fresh, frozen, and canned vegetables. Fruits  All plain fresh, frozen, canned, and dried fruits, and 100% fruit juices. Meats and other protein foods  All fresh beef, pork, poultry, fish, seafood, and eggs. Fish canned in water, oil, brine, or vegetable broth. Plain nuts and seeds, peanut butter. Some lunch meat and some frankfurters. Dried beans, dried peas, and lentils. Dairy  Fresh plain, dry, evaporated, or condensed milk. Cream, butter, sour cream, whipping cream, and most yogurts. Unprocessed cheese, most processed cheeses, some cottage cheese, some cream cheeses. Beverages  Coffee, tea, most herbal teas. Carbonated beverages and some root beers.   Wine, sake, and distilled spirits, such as gin, vodka, and whiskey. Most hard ciders. Fats and oils  Butter, margarine, vegetable oil, hydrogenated butter, olive  oil, shortening, lard, cream, and some mayonnaise. Some commercial salad dressings. Olives. Sweets and desserts  Sugar, honey, some syrups, molasses, jelly, and jam. Plain hard candy, marshmallows, and gumdrops. Pure cocoa powder. Plain chocolate. Custard and some pudding mixes. Gelatin desserts, sorbets, frozen ice pops, and sherbet. Cake, cookies, and other desserts prepared with allowed flours. Some commercial ice creams. Cornstarch, tapioca, and rice puddings. Seasoning and other foods  Some canned or frozen soups. Monosodium glutamate (MSG). Cider, rice, and wine vinegar. Baking soda and baking powder. Cream of tartar. Baking and nutritional yeast. Certain soy sauces made without wheat (ask your dietitian about specific brands that are allowed). Nuts, coconut, and chocolate. Salt, pepper, herbs, spices, flavoring extracts, imitation or artificial flavorings, natural flavorings, and food colorings. Some medicines and supplements. Some lip glosses and other cosmetics. Rice syrups. The items listed may not be a complete list. Talk with your dietitian about what dietary choices are best for you. Foods to avoid Grains  Barley, bran, bulgur, couscous, cracked wheat, Spur, farro, graham, malt, matzo, semolina, wheat germ, and all wheat and rye cereals including spelt and kamut. Cereals containing malt as a flavoring, such as rice cereal. Noodles, spaghetti, macaroni, most packaged rice mixes, and all mixes containing wheat, rye, barley, or triticale. Vegetables  Most creamed vegetables and most vegetables canned in sauces. Some commercially prepared vegetables and salads. Fruits  Thickened or prepared fruits and some pie fillings. Some fruit snacks and fruit roll-ups. Meats and other protein foods  Any meat or meat alternative containing wheat, rye, barley, or gluten stabilizers. These are often marinated or packaged meats and lunch meats. Bread-containing products, such as Swiss steak,  croquettes, meatballs, and meatloaf. Most tuna canned in vegetable broth and turkey with hydrolyzed vegetable protein (HVP) injected as part of the basting. Seitan. Imitation fish. Eggs in sauces made from ingredients to avoid. Dairy  Commercial chocolate milk drinks and malted milk. Some non-dairy creamers. Any cheese product containing ingredients to avoid. Beverages  Certain cereal beverages. Beer, ale, malted milk, and some root beers. Some hard ciders. Some instant flavored coffees. Some herbal teas made with barley or with barley malt added. Fats and oils  Some commercial salad dressings. Sour cream containing modified food starch. Sweets and desserts  Some toffees. Chocolate-coated nuts (may be rolled in wheat flour) and some commercial candies and candy bars. Most cakes, cookies, donuts, pastries, and other baked goods. Some commercial ice cream. Ice cream cones. Commercially prepared mixes for cakes, cookies, and other desserts. Bread pudding and other puddings thickened with flour. Products containing brown rice syrup made with barley malt enzyme. Desserts and sweets made with malt flavoring. Seasoning and other foods  Some curry powders, some dry seasoning mixes, some gravy extracts, some meat sauces, some ketchups, some prepared mustards, and horseradish. Certain soy sauces. Malt vinegar. Bouillon and bouillon cubes that contain HVP. Some chip dips, and some chewing gum. Yeast extract. Brewer's yeast. Caramel color. Some medicines and supplements. Some lip glosses and other cosmetics. The items listed may not be a complete list. Talk with your dietitian about what dietary choices are best for you. Summary  Gluten is a protein that is found in wheat, rye, barley, and some other grains. The gluten-free diet includes all foods that do not contain gluten.  If you need help finding gluten-free foods or if   you have questions, talk with your diet and nutrition specialist (registered  dietitian) or your health care provider.  Read all food labels. Gluten is often added to foods. Always check the ingredient list and look for warnings, such as "may contain gluten." This information is not intended to replace advice given to you by your health care provider. Make sure you discuss any questions you have with your health care provider. Document Released: 11/29/2005 Document Revised: 09/13/2016 Document Reviewed: 09/13/2016 Elsevier Interactive Patient Education  2018 Elsevier Inc.   

## 2018-10-30 DIAGNOSIS — D485 Neoplasm of uncertain behavior of skin: Secondary | ICD-10-CM | POA: Diagnosis not present

## 2018-10-30 DIAGNOSIS — L700 Acne vulgaris: Secondary | ICD-10-CM | POA: Diagnosis not present

## 2018-10-30 DIAGNOSIS — L720 Epidermal cyst: Secondary | ICD-10-CM | POA: Diagnosis not present

## 2018-12-17 ENCOUNTER — Other Ambulatory Visit: Payer: Self-pay | Admitting: Internal Medicine

## 2018-12-27 ENCOUNTER — Encounter: Payer: BLUE CROSS/BLUE SHIELD | Admitting: Internal Medicine

## 2019-01-29 ENCOUNTER — Encounter: Payer: Self-pay | Admitting: Internal Medicine

## 2019-01-29 ENCOUNTER — Ambulatory Visit (INDEPENDENT_AMBULATORY_CARE_PROVIDER_SITE_OTHER): Payer: BLUE CROSS/BLUE SHIELD | Admitting: Internal Medicine

## 2019-01-29 VITALS — BP 134/82 | HR 58 | Temp 98.4°F | Ht 61.0 in | Wt 151.0 lb

## 2019-01-29 DIAGNOSIS — R7989 Other specified abnormal findings of blood chemistry: Secondary | ICD-10-CM | POA: Diagnosis not present

## 2019-01-29 DIAGNOSIS — Z0000 Encounter for general adult medical examination without abnormal findings: Secondary | ICD-10-CM | POA: Diagnosis not present

## 2019-01-29 DIAGNOSIS — E785 Hyperlipidemia, unspecified: Secondary | ICD-10-CM | POA: Diagnosis not present

## 2019-01-29 DIAGNOSIS — I10 Essential (primary) hypertension: Secondary | ICD-10-CM | POA: Diagnosis not present

## 2019-01-29 DIAGNOSIS — Z23 Encounter for immunization: Secondary | ICD-10-CM

## 2019-01-29 MED ORDER — HYDROCHLOROTHIAZIDE 25 MG PO TABS: 25.0000 mg | ORAL_TABLET | Freq: Every day | ORAL | 0 refills | Status: DC

## 2019-01-29 MED ORDER — POTASSIUM CHLORIDE ER 8 MEQ PO TBCR: EXTENDED_RELEASE_TABLET | ORAL | 0 refills | Status: DC

## 2019-01-29 NOTE — Assessment & Plan Note (Addendum)
CT card calcium scoring "0"  Vascepa - did not tolerate well

## 2019-01-29 NOTE — Progress Notes (Signed)
Subjective:  Patient ID: Cheryl Jones, female    DOB: 1961/06/01  Age: 58 y.o. MRN: [id number]  CC: No chief complaint on file.   HPI [name] presents for a well exam  Outpatient Medications Prior to Visit  Medication Sig Dispense Refill  . Alpha-Lipoic Acid 600 MG TABS Take by mouth.    . calcium-vitamin D 250-100 MG-UNIT tablet Take 1 tablet by mouth daily.    . Cholecalciferol (VITAMIN D PO) Take 1 tablet by mouth daily.    . hydrochlorothiazide (HYDRODIURIL) 25 MG tablet Take 1 tablet (25 mg total) by mouth daily. 90 tablet 0  . Icosapent Ethyl (VASCEPA) 1 g CAPS Take 2 capsules (2 g total) by mouth 2 (two) times daily. 120 capsule 11  . KLOR-CON 8 MEQ tablet TAKE 1 TABLET (8 MEQ TOTAL) BY MOUTH DAILY. 90 tablet 0  . loratadine (CLARITIN) 10 MG tablet Take 10 mg by mouth daily.      Marland Kitchen MAGNESIUM PO Take 150 mg by mouth daily.    . nebivolol (BYSTOLIC) 10 MG tablet Take 1 tablet (10 mg total) by mouth daily. 30 tablet 11  . Nutritional Supplements (JUICE PLUS FIBRE PO) Take 2 tablets by mouth daily.    Marland Kitchen omeprazole (PRILOSEC) 40 MG capsule TAKE 1 CAPSULE BY MOUTH EVERY DAY 90 capsule 1  . PAZEO 0.7 % SOLN     . Probiotic Product (PROBIOTIC DAILY PO) Take by mouth daily.     No facility-administered medications prior to visit.     ROS: Review of Systems  Constitutional: Negative for activity change, appetite change, chills, fatigue and unexpected weight change.  HENT: Negative for congestion, mouth sores and sinus pressure.   Eyes: Negative for visual disturbance.  Respiratory: Negative for cough and chest tightness.   Gastrointestinal: Negative for abdominal pain and nausea.  Genitourinary: Negative for difficulty urinating, frequency and vaginal pain.  Musculoskeletal: Negative for back pain and gait problem.  Skin: Negative for pallor and rash.  Neurological: Negative for dizziness, tremors, weakness, numbness and headaches.  Psychiatric/Behavioral: Negative for  confusion, sleep disturbance and suicidal ideas.    Objective:  BP 134/82 (BP Location: Left Arm, Patient Position: Sitting, Cuff Size: Normal)   Pulse (!) 58   Temp 98.4 F (36.9 C) (Oral)   Ht 5\' 1"  (1.549 m)   Wt 151 lb (68.5 kg)   SpO2 99%   BMI 28.53 kg/m   BP Readings from Last 3 Encounters:  01/29/19 134/82  10/23/18 132/82  10/03/18 (!) 144/80    Wt Readings from Last 3 Encounters:  01/29/19 151 lb (68.5 kg)  10/23/18 153 lb (69.4 kg)  10/03/18 150 lb (68 kg)    Physical Exam Constitutional:      General: She is not in acute distress.    Appearance: She is well-developed.  HENT:     Head: Normocephalic.     Right Ear: External ear normal.     Left Ear: External ear normal.     Nose: Nose normal.  Eyes:     General:        Right eye: No discharge.        Left eye: No discharge.     Conjunctiva/sclera: Conjunctivae normal.     Pupils: Pupils are equal, round, and reactive to light.  Neck:     Musculoskeletal: Normal range of motion and neck supple.     Thyroid: No thyromegaly.     Vascular: No JVD.     Trachea:  No tracheal deviation.  Cardiovascular:     Rate and Rhythm: Normal rate and regular rhythm.     Heart sounds: Normal heart sounds.  Pulmonary:     Effort: No respiratory distress.     Breath sounds: No stridor. No wheezing.  Abdominal:     General: Bowel sounds are normal. There is no distension.     Palpations: Abdomen is soft. There is no mass.     Tenderness: There is no abdominal tenderness. There is no guarding or rebound.  Musculoskeletal:        General: No tenderness.  Lymphadenopathy:     Cervical: No cervical adenopathy.  Skin:    Findings: No erythema or rash.  Neurological:     Cranial Nerves: No cranial nerve deficit.     Motor: No abnormal muscle tone.     Coordination: Coordination normal.     Deep Tendon Reflexes: Reflexes normal.  Psychiatric:        Behavior: Behavior normal.        Thought Content: Thought content  normal.        Judgment: Judgment normal.     Lab Results  Component Value Date   WBC 6.9 04/05/2018   HGB 13.7 04/05/2018   HCT 39.5 04/05/2018   PLT 245.0 04/05/2018   GLUCOSE 97 10/03/2018   CHOL 286 (H) 10/03/2018   TRIG 151.0 (H) 10/03/2018   HDL 54.80 10/03/2018   LDLDIRECT 160.9 04/20/2013   LDLCALC 201 (H) 10/03/2018   ALT 11 01/18/2018   AST 12 01/18/2018   NA 140 10/03/2018   K 3.9 10/03/2018   CL 103 10/03/2018   CREATININE 1.27 (H) 10/03/2018   BUN 22 10/03/2018   CO2 29 10/03/2018   TSH 0.29 (L) 05/05/2017   HGBA1C 5.7 01/02/2018    Ct Cardiac Scoring  Addendum Date: 06/08/2018   ADDENDUM REPORT: 06/08/2018 14:52 ADDENDUM: OVER-READ INTERPRETATION  CT CHEST The following report is an over-read performed by radiologist Dr. Arliss Journey [facility] on 10/16/2013. This over-read does not include interpretation of cardiac or coronary anatomy or pathology. The calcium score interpretation by the cardiologist is attached. COMPARISON: 02/13/2004 FINDINGS: Vascular: Normal aortic caliber.  Mild descending aortic tortuosity. Mediastinum/Nodes: No imaged thoracic adenopathy. A large hiatal hernia, with greater than 1/2 of the stomach positioned in the chest. Lungs/Pleura: No pleural fluid. Left lower lobe subsegmental atelectasis with volume loss. Upper Abdomen: Normal imaged portions of the liver, spleen. Musculoskeletal: No acute osseous abnormality. IMPRESSION: 1.  No acute findings in the imaged extracardiac chest. 2. Large hiatal hernia. Electronically Signed   By: Jeronimo Greaves M.D.   On: 06/08/2018 14:52   Result Date: 06/08/2018 CLINICAL DATA:  Risk stratification EXAM: Coronary Calcium Score TECHNIQUE: The patient was scanned on a Siemens Somatom 64 slice scanner. Axial non-contrast 28mm slices were carried out through the heart. The data set was analyzed on a dedicated work station and scored using the Agatson method. FINDINGS: Non-cardiac: No significant non  cardiac findings on limited lung and soft tissue windows. See separate report from [facility]. Ascending Aorta: Normal diameter 3.1 cm Pericardium: Normal Coronary arteries: No calcium detected IMPRESSION: Coronary calcium score of 0. Large Hiatal Hernia Charlton Haws Electronically Signed: By: Charlton Haws M.D. On: 06/08/2018 14:13    Assessment & Plan:   There are no diagnoses linked to this encounter.   No orders of the defined types were placed in this encounter.    Follow-up: No follow-ups on file.  Walker Kehr, MD

## 2019-01-29 NOTE — Addendum Note (Signed)
Addended by: Scarlett Presto on: 01/29/2019 09:32 AM   Modules accepted: Orders

## 2019-01-29 NOTE — Assessment & Plan Note (Addendum)
We discussed age appropriate health related issues, including available/recomended screening tests and vaccinations. We discussed a need for adhering to healthy diet and exercise. Labs were ordered to be later reviewed . All questions were answered. 2019 Coronary calcium score of 0.

## 2019-01-29 NOTE — Assessment & Plan Note (Signed)
Labs

## 2019-01-29 NOTE — Assessment & Plan Note (Signed)
Clonidine prn 

## 2019-04-23 ENCOUNTER — Other Ambulatory Visit: Payer: Self-pay | Admitting: Internal Medicine

## 2019-04-23 ENCOUNTER — Ambulatory Visit: Payer: BLUE CROSS/BLUE SHIELD

## 2019-04-26 ENCOUNTER — Telehealth: Payer: Self-pay | Admitting: Internal Medicine

## 2019-05-02 NOTE — Telephone Encounter (Signed)
Pt called and states that pharmacist asked her if she had a coupon for medication - she is wanting to know if PCP can give her that.

## 2019-05-02 NOTE — Telephone Encounter (Signed)
Spoke to patient and was informed right now we do not have any but she can one offline. She will try and if not call back

## 2019-05-22 ENCOUNTER — Ambulatory Visit: Payer: BLUE CROSS/BLUE SHIELD

## 2019-05-23 ENCOUNTER — Other Ambulatory Visit: Payer: Self-pay

## 2019-05-23 ENCOUNTER — Ambulatory Visit (INDEPENDENT_AMBULATORY_CARE_PROVIDER_SITE_OTHER): Payer: BC Managed Care – PPO

## 2019-05-23 DIAGNOSIS — Z23 Encounter for immunization: Secondary | ICD-10-CM | POA: Diagnosis not present

## 2019-05-23 DIAGNOSIS — Z299 Encounter for prophylactic measures, unspecified: Secondary | ICD-10-CM

## 2019-05-31 ENCOUNTER — Ambulatory Visit: Payer: BLUE CROSS/BLUE SHIELD

## 2019-06-17 ENCOUNTER — Other Ambulatory Visit: Payer: Self-pay | Admitting: Internal Medicine

## 2019-07-17 ENCOUNTER — Encounter: Payer: Self-pay | Admitting: Internal Medicine

## 2019-07-19 DIAGNOSIS — Z1231 Encounter for screening mammogram for malignant neoplasm of breast: Secondary | ICD-10-CM | POA: Diagnosis not present

## 2019-07-26 ENCOUNTER — Other Ambulatory Visit: Payer: Self-pay | Admitting: Internal Medicine

## 2019-07-27 DIAGNOSIS — Z01419 Encounter for gynecological examination (general) (routine) without abnormal findings: Secondary | ICD-10-CM | POA: Diagnosis not present

## 2019-07-27 DIAGNOSIS — Z6830 Body mass index (BMI) 30.0-30.9, adult: Secondary | ICD-10-CM | POA: Diagnosis not present

## 2019-10-04 ENCOUNTER — Other Ambulatory Visit: Payer: Self-pay

## 2019-10-04 ENCOUNTER — Ambulatory Visit (INDEPENDENT_AMBULATORY_CARE_PROVIDER_SITE_OTHER): Payer: BC Managed Care – PPO | Admitting: Gastroenterology

## 2019-10-04 ENCOUNTER — Encounter: Payer: Self-pay | Admitting: Gastroenterology

## 2019-10-04 VITALS — BP 146/90 | HR 64 | Temp 98.0°F | Ht 60.0 in | Wt 153.1 lb

## 2019-10-04 DIAGNOSIS — K219 Gastro-esophageal reflux disease without esophagitis: Secondary | ICD-10-CM | POA: Diagnosis not present

## 2019-10-04 DIAGNOSIS — K449 Diaphragmatic hernia without obstruction or gangrene: Secondary | ICD-10-CM | POA: Diagnosis not present

## 2019-10-04 MED ORDER — OMEPRAZOLE 40 MG PO CPDR: 40.0000 mg | DELAYED_RELEASE_CAPSULE | Freq: Every day | ORAL | 4 refills | Status: DC

## 2019-10-04 MED ORDER — OMEPRAZOLE 40 MG PO CPDR: 40.0000 mg | DELAYED_RELEASE_CAPSULE | Freq: Every day | ORAL | 4 refills | Status: AC

## 2019-10-04 NOTE — Patient Instructions (Addendum)
We have sent the following medications to your pharmacy for you to pick up at your convenience:  Omeprazole 40 mg daily  

## 2019-10-04 NOTE — Progress Notes (Signed)
10/04/2019 Cheryl Jones [id number] 1961-04-07   HISTORY OF PRESENT ILLNESS: This is a pleasant 58 year old female who is a patient of Dr. Vena Rua.  She has past medical history of GERD with erosive esophagitis, moderate to large size hiatal hernia, kidney stones, colonic diverticulosis, and hypertension who is here for follow-up.  Her last visit here was May 2019.  She has a history of reflux esophagitis without Barrett's esophagus felt secondary to her large hiatal hernia.  This was seen at the time of her upper endoscopy performed by Dr. Fuller Plan in 2017.  At that time she had erosive LA grade C esophagitis, question of Barrett's which was biopsied and a few nonbleeding gastric erosions.  Biopsies showed reflux inflammation but no Barrett's metaplasia.  There was reactive gastropathy without H. pylori from gastric biopsies.  Then she underwent EGD with Dr. Hilarie Fredrickson in July 2019 at which time she was found to have just a large hiatal hernia.  She has been maintained on omeprazole 40 mg daily.  She is here today just for yearly follow-up.  Overall is doing well.  Gets occasional epigastric discomfort.  Is not ready to have hiatal hernia repair.  She tells me that they are actually relocating to [place], [place] at the beginning of November.  Is asking for physician recommendations in that area or the [place] area.   Past Medical History:  Diagnosis Date  . Diverticulosis   . Erosive esophagitis   . GERD (gastroesophageal reflux disease)   . Hiatal hernia   . Hypertension   . Kidney stone   . Plantar fasciitis    Past Surgical History:  Procedure Laterality Date  . DILATION AND CURETTAGE OF UTERUS    . ESOPHAGEAL MANOMETRY N/A 03/29/2016   Procedure: ESOPHAGEAL MANOMETRY (EM);  Surgeon: Ladene Artist, MD;  Location: [facility];  Service: Endoscopy;  Laterality: N/A;  . HERNIA REPAIR    . INGUINAL HERNIA REPAIR  2002   right  . WISDOM TOOTH EXTRACTION  01/20/2017    reports that she has never smoked. She has never used smokeless tobacco. She reports current alcohol use of about 3.0 standard drinks of alcohol per week. She reports that she does not use drugs. family history includes Colon cancer (age of onset: 53) in her paternal uncle; Heart disease in her father; Hyperlipidemia in her father and mother; Hypertension in her father, mother, and another family member. Allergies  Allergen Reactions  . Norvasc [Amlodipine]     swelling  . Vascepa [Icosapent Ethyl]     Not feeling well on it      Outpatient Encounter Medications as of 10/04/2019  Medication Sig  . Alpha-Lipoic Acid 200 MG CAPS Take 1 capsule by mouth daily.  Marland Kitchen BYSTOLIC 10 MG tablet TAKE 1 TABLET BY MOUTH EVERY DAY  . calcium-vitamin D 250-100 MG-UNIT tablet Take 1 tablet by mouth daily.  . Cholecalciferol (VITAMIN D3) 50 MCG (2000 UT) TABS Take 1 tablet by mouth daily.  . hydrochlorothiazide (HYDRODIURIL) 25 MG tablet TAKE 1 TABLET BY MOUTH EVERY DAY  . loratadine (CLARITIN) 10 MG tablet Take 10 mg by mouth daily.    Marland Kitchen MAGNESIUM PO Take 150 mg by mouth daily.  . Multiple Vitamin (MULTIVITAMIN) tablet Take 1 tablet by mouth daily.  Marland Kitchen omeprazole (PRILOSEC) 40 MG capsule Take 1 capsule (40 mg total) by mouth daily. NEEDS OFFICE VISIT FOR FURTHER REFILLS  . PAZEO 0.7 % SOLN as needed (seasonal).   . potassium chloride (  KLOR-CON) 8 MEQ tablet TAKE 1 TABLET BY MOUTH EVERY DAY  . Probiotic Product (PROBIOTIC DAILY PO) Take by mouth daily.  . [DISCONTINUED] Alpha-Lipoic Acid 600 MG TABS Take by mouth.  . [DISCONTINUED] Cholecalciferol (VITAMIN D PO) Take 1 tablet by mouth daily.  . [DISCONTINUED] Icosapent Ethyl (VASCEPA) 1 g CAPS Take 2 capsules (2 g total) by mouth 2 (two) times daily.  . [DISCONTINUED] Nutritional Supplements (JUICE PLUS FIBRE PO) Take 2 tablets by mouth daily.   No facility-administered encounter medications on file as of 10/04/2019.      REVIEW OF SYSTEMS  : All other  systems reviewed and negative except where noted in the History of Present Illness.   PHYSICAL EXAM: BP (!) 146/90 (BP Location: Left Arm, Patient Position: Sitting, Cuff Size: Normal)   Pulse 64   Temp 98 F (36.7 C)   Ht 5' (1.524 m) Comment: height measured without shoes  Wt 153 lb 2 oz (69.5 kg)   BMI 29.91 kg/m  General: Well developed white female in no acute distress Head: Normocephalic and atraumatic Eyes:  Sclerae anicteric, conjunctiva pink. Ears: Normal auditory acuity Lungs: Clear throughout to auscultation; no increased WOB. Heart: Regular rate and rhythm; no M/R/G. Abdomen: Soft, non-distended.  BS present.  Non-tender.   Musculoskeletal: Symmetrical with no gross deformities  Skin: No lesions on visible extremities Extremities: No edema  Neurological: Alert oriented x 4, grossly non-focal Psychological:  Alert and cooperative. Normal mood and affect  ASSESSMENT AND PLAN: *GERD and moderate to large hiatal hernia: Symptoms overall well controlled on omeprazole 40 mg daily.  She is not ready to consider hiatal hernia repair at this point.  We will continue her PPI.  New prescription printed for her 90-day supply.  The patient is relocating to [facility] in early November.  She was asking if Dr. Rhea Belton had any recommendations for a gastroenterologist or surgeon in case she decides to do a hiatal hernia repair in the [facility] or [place] area.  I told her that if he had any recommendations we would contact her with those names.   CC:  Plotnikov, Georgina Quint, MD

## 2019-10-11 NOTE — Progress Notes (Signed)
Addendum: Reviewed and agree with assessment and management plan. Kathlee Barnhardt M, MD  

## 2019-10-29 DIAGNOSIS — N1330 Unspecified hydronephrosis: Secondary | ICD-10-CM | POA: Diagnosis not present

## 2019-12-04 DIAGNOSIS — H35033 Hypertensive retinopathy, bilateral: Secondary | ICD-10-CM | POA: Diagnosis not present

## 2019-12-06 ENCOUNTER — Telehealth: Payer: Self-pay | Admitting: Family Medicine

## 2019-12-06 ENCOUNTER — Ambulatory Visit: Payer: Self-pay | Admitting: *Deleted

## 2019-12-06 ENCOUNTER — Other Ambulatory Visit: Payer: Self-pay | Admitting: Family Medicine

## 2019-12-06 ENCOUNTER — Encounter: Payer: Self-pay | Admitting: Family Medicine

## 2019-12-06 MED ORDER — CLONIDINE HCL 0.1 MG PO TABS: 0.1000 mg | ORAL_TABLET | Freq: Three times a day (TID) | ORAL | 0 refills | Status: AC | PRN

## 2019-12-06 NOTE — Telephone Encounter (Signed)
Pls sch OV Thx 

## 2019-12-06 NOTE — Telephone Encounter (Addendum)
Pt called with wanting advice on her blood pressure. This morning it was 153/108 and she had a headache. No other symptoms. She took her regular medications and hadded a dose of Clonidine 0.1mg  and her b/p is now 130/95.  She also takes Hydrochlorothiazide.  Now she wants advice on what she do over the christmas holiday if her b/p is elevated again.  Should she take half dose of  Bystolic or clonidine? She has about 7 left. Pt advised that if her b/p is elevated and she is having symptoms of severe headache, blurred vision, weakness or dizziness to go to the ED. She voiced understanding. Will notify the provider on call.  Dr. Ethlyn Gallery notified and will call the patient.  Reason for Disposition . [1] Caller has URGENT medication question about med that PCP or specialist prescribed AND [2] triager unable to answer question  Answer Assessment - Initial Assessment Questions 1. REASON FOR CALL or QUESTION: "What is your reason for calling today?" or "How can I best help you?" or "What question do you have that I can help answer?"     See note  Protocols used: MEDICATION QUESTION CALL-A-[facility], [place]

## 2019-12-06 NOTE — Telephone Encounter (Signed)
Sent mychart message to schedule.

## 2019-12-06 NOTE — Telephone Encounter (Signed)
Patient with elevated blood pressure. Not checking regularly but was elevated on Tuesday of this week at eye appointment. Checked with wrist cuff and diastolic was over 950 (doesn't remember systolic).   Had mild headache yesterday and feels related to bp elevated. This morning was 153/108. Took her medications (bystolic, hctz) and then took clonidine 0.1mg  and pressure came down to 130/95 when checked 4 hours later.   Reading on phone was 171/112 (now has been about 6 hours since last dose). Has mild headache but better than previous. No chest pain, chest pressure, vision changes, light headedness, dizziness. HR has been in the 57-70 range.   States that previously was on valsartan and pressures were better controlled. States med stopped due to pharmacy issue obtaining and has had more difficulty controlling on current meds.   Advised ok to take additional clonidine up to TID as needed. If pressure still over 160 one hour after taking can take additional clonidine. Sent in refill for her as she is almost out of medication. Advised to take extra HCTZ tomorrow morning (total 50mg ). Advised she will need follow up with Dr. Alain Marion as she is due for bloodwork and we need to be current on these numbers in order to adjust/select best bp medications. Advised rest, adequate water, and limiting sodium intake.   Advised to seek care if pressures not improved with above treatment or worsening of symptoms.

## 2019-12-10 ENCOUNTER — Encounter: Payer: Self-pay | Admitting: Internal Medicine

## 2019-12-10 ENCOUNTER — Other Ambulatory Visit: Payer: Self-pay

## 2019-12-10 ENCOUNTER — Ambulatory Visit (INDEPENDENT_AMBULATORY_CARE_PROVIDER_SITE_OTHER): Payer: BC Managed Care – PPO | Admitting: Internal Medicine

## 2019-12-10 VITALS — BP 144/92 | HR 73 | Temp 98.1°F | Ht 60.0 in | Wt 155.0 lb

## 2019-12-10 DIAGNOSIS — Z8632 Personal history of gestational diabetes: Secondary | ICD-10-CM

## 2019-12-10 DIAGNOSIS — R7989 Other specified abnormal findings of blood chemistry: Secondary | ICD-10-CM | POA: Diagnosis not present

## 2019-12-10 DIAGNOSIS — R030 Elevated blood-pressure reading, without diagnosis of hypertension: Secondary | ICD-10-CM | POA: Diagnosis not present

## 2019-12-10 DIAGNOSIS — I10 Essential (primary) hypertension: Secondary | ICD-10-CM

## 2019-12-10 LAB — URINALYSIS, ROUTINE W REFLEX MICROSCOPIC
Bilirubin Urine: NEGATIVE
Hgb urine dipstick: NEGATIVE
Ketones, ur: NEGATIVE
Leukocytes,Ua: NEGATIVE
Nitrite: NEGATIVE
RBC / HPF: NONE SEEN (ref 0–?)
Specific Gravity, Urine: <=1.005 — AB (ref 1.000–1.030)
Total Protein, Urine: NEGATIVE
Urine Glucose: NEGATIVE
Urobilinogen, UA: 0.2 (ref 0.0–1.0)
WBC, UA: NONE SEEN (ref 0–?)
pH: 6.5 (ref 5.0–8.0)

## 2019-12-10 LAB — CBC
HCT: 43.8 % (ref 36.0–46.0)
Hemoglobin: 14.9 g/dL (ref 12.0–15.0)
MCHC: 33.9 g/dL (ref 30.0–36.0)
MCV: 92.5 fl (ref 78.0–100.0)
Platelets: 253 10*3/uL (ref 150.0–400.0)
RBC: 4.73 Mil/uL (ref 3.87–5.11)
RDW: 13.2 % (ref 11.5–15.5)
WBC: 6.3 10*3/uL (ref 4.0–10.5)

## 2019-12-10 LAB — COMPREHENSIVE METABOLIC PANEL
ALT: 14 U/L (ref 0–35)
AST: 21 U/L (ref 0–37)
Albumin: 4.4 g/dL (ref 3.5–5.2)
Alkaline Phosphatase: 72 U/L (ref 39–117)
BUN: 23 mg/dL (ref 6–23)
CO2: 27 mEq/L (ref 19–32)
Calcium: 9.5 mg/dL (ref 8.4–10.5)
Chloride: 103 mEq/L (ref 96–112)
Creatinine, Ser: 1.43 mg/dL — ABNORMAL HIGH (ref 0.40–1.20)
GFR: 37.71 mL/min — ABNORMAL LOW (ref >60.00)
Glucose, Bld: 90 mg/dL (ref 70–99)
Potassium: 4.3 mEq/L (ref 3.5–5.1)
Sodium: 136 mEq/L (ref 135–145)
Total Bilirubin: 0.5 mg/dL (ref 0.2–1.2)
Total Protein: 6.8 g/dL (ref 6.0–8.3)

## 2019-12-10 LAB — LIPID PANEL
Cholesterol: 284 mg/dL — ABNORMAL HIGH (ref 0–200)
HDL: 54.5 mg/dL (ref >39.00)
LDL Cholesterol: 198 mg/dL — ABNORMAL HIGH (ref 0–99)
NonHDL: 229.07
Total CHOL/HDL Ratio: 5
Triglycerides: 155 mg/dL — ABNORMAL HIGH (ref 0.0–149.0)
VLDL: 31 mg/dL (ref 0.0–40.0)

## 2019-12-10 LAB — HEMOGLOBIN A1C: Hgb A1c MFr Bld: 5.4 % (ref 4.6–6.5)

## 2019-12-10 NOTE — Patient Instructions (Addendum)
Work on the salt intake to help keep the blood pressure lower.   DASH Eating Plan DASH stands for "Dietary Approaches to Stop Hypertension." The DASH eating plan is a healthy eating plan that has been shown to reduce high blood pressure (hypertension). It may also reduce your risk for type 2 diabetes, heart disease, and stroke. The DASH eating plan may also help with weight loss. What are tips for following this plan?  General guidelines  Avoid eating more than 2,300 mg (milligrams) of salt (sodium) a day. If you have hypertension, you may need to reduce your sodium intake to 1,500 mg a day.  Limit alcohol intake to no more than 1 drink a day for nonpregnant women and 2 drinks a day for men. One drink equals 12 oz of beer, 5 oz of wine, or 1 oz of hard liquor.  Work with your health care provider to maintain a healthy body weight or to lose weight. Ask what an ideal weight is for you.  Get at least 30 minutes of exercise that causes your heart to beat faster (aerobic exercise) most days of the week. Activities may include walking, swimming, or biking.  Work with your health care provider or diet and nutrition specialist (dietitian) to adjust your eating plan to your individual calorie needs. Reading food labels   Check food labels for the amount of sodium per serving. Choose foods with less than 5 percent of the Daily Value of sodium. Generally, foods with less than 300 mg of sodium per serving fit into this eating plan.  To find whole grains, look for the word "whole" as the first word in the ingredient list. Shopping  Buy products labeled as "low-sodium" or "no salt added."  Buy fresh foods. Avoid canned foods and premade or frozen meals. Cooking  Avoid adding salt when cooking. Use salt-free seasonings or herbs instead of table salt or sea salt. Check with your health care provider or pharmacist before using salt substitutes.  Do not fry foods. Cook foods using healthy methods  such as baking, boiling, grilling, and broiling instead.  Cook with heart-healthy oils, such as olive, canola, soybean, or sunflower oil. Meal planning  Eat a balanced diet that includes: ? 5 or more servings of fruits and vegetables each day. At each meal, try to fill half of your plate with fruits and vegetables. ? Up to 6-8 servings of whole grains each day. ? Less than 6 oz of lean meat, poultry, or fish each day. A 3-oz serving of meat is about the same size as a deck of cards. One egg equals 1 oz. ? 2 servings of low-fat dairy each day. ? A serving of nuts, seeds, or beans 5 times each week. ? Heart-healthy fats. Healthy fats called Omega-3 fatty acids are found in foods such as flaxseeds and coldwater fish, like sardines, salmon, and mackerel.  Limit how much you eat of the following: ? Canned or prepackaged foods. ? Food that is high in trans fat, such as fried foods. ? Food that is high in saturated fat, such as fatty meat. ? Sweets, desserts, sugary drinks, and other foods with added sugar. ? Full-fat dairy products.  Do not salt foods before eating.  Try to eat at least 2 vegetarian meals each week.  Eat more home-cooked food and less restaurant, buffet, and fast food.  When eating at a restaurant, ask that your food be prepared with less salt or no salt, if possible. What foods are recommended?  The items listed may not be a complete list. Talk with your dietitian about what dietary choices are best for you. Grains Whole-grain or whole-wheat bread. Whole-grain or whole-wheat pasta. Brown rice. Modena Morrow. Bulgur. Whole-grain and low-sodium cereals. Pita bread. Low-fat, low-sodium crackers. Whole-wheat flour tortillas. Vegetables Fresh or frozen vegetables (raw, steamed, roasted, or grilled). Low-sodium or reduced-sodium tomato and vegetable juice. Low-sodium or reduced-sodium tomato sauce and tomato paste. Low-sodium or reduced-sodium canned vegetables. Fruits All  fresh, dried, or frozen fruit. Canned fruit in natural juice (without added sugar). Meat and other protein foods Skinless chicken or [place]. Ground chicken or [place]. Pork with fat trimmed off. Fish and seafood. Egg whites. Dried beans, peas, or lentils. Unsalted nuts, nut butters, and seeds. Unsalted canned beans. Lean cuts of beef with fat trimmed off. Low-sodium, lean deli meat. Dairy Low-fat (1%) or fat-free (skim) milk. Fat-free, low-fat, or reduced-fat cheeses. Nonfat, low-sodium ricotta or cottage cheese. Low-fat or nonfat yogurt. Low-fat, low-sodium cheese. Fats and oils Soft margarine without trans fats. Vegetable oil. Low-fat, reduced-fat, or light mayonnaise and salad dressings (reduced-sodium). Canola, safflower, olive, soybean, and sunflower oils. Avocado. Seasoning and other foods Herbs. Spices. Seasoning mixes without salt. Unsalted popcorn and pretzels. Fat-free sweets. What foods are not recommended? The items listed may not be a complete list. Talk with your dietitian about what dietary choices are best for you. Grains Baked goods made with fat, such as croissants, muffins, or some breads. Dry pasta or rice meal packs. Vegetables Creamed or fried vegetables. Vegetables in a cheese sauce. Regular canned vegetables (not low-sodium or reduced-sodium). Regular canned tomato sauce and paste (not low-sodium or reduced-sodium). Regular tomato and vegetable juice (not low-sodium or reduced-sodium). Angie Fava. Olives. Fruits Canned fruit in a light or heavy syrup. Fried fruit. Fruit in cream or butter sauce. Meat and other protein foods Fatty cuts of meat. Ribs. Fried meat. Berniece Salines. Sausage. Bologna and other processed lunch meats. Salami. Fatback. Hotdogs. Bratwurst. Salted nuts and seeds. Canned beans with added salt. Canned or smoked fish. Whole eggs or egg yolks. Chicken or [place] with skin. Dairy Whole or 2% milk, cream, and half-and-half. Whole or full-fat cream cheese. Whole-fat or  sweetened yogurt. Full-fat cheese. Nondairy creamers. Whipped toppings. Processed cheese and cheese spreads. Fats and oils Butter. Stick margarine. Lard. Shortening. Ghee. Bacon fat. Tropical oils, such as coconut, palm kernel, or palm oil. Seasoning and other foods Salted popcorn and pretzels. Onion salt, garlic salt, seasoned salt, table salt, and sea salt. Worcestershire sauce. Tartar sauce. Barbecue sauce. Teriyaki sauce. Soy sauce, including reduced-sodium. Steak sauce. Canned and packaged gravies. Fish sauce. Oyster sauce. Cocktail sauce. Horseradish that you find on the shelf. Ketchup. Mustard. Meat flavorings and tenderizers. Bouillon cubes. Hot sauce and Tabasco sauce. Premade or packaged marinades. Premade or packaged taco seasonings. Relishes. Regular salad dressings. Where to find more information:  National Heart, Lung, and [place]: https://wilson-eaton.com/  American Heart Association: www.heart.org Summary  The DASH eating plan is a healthy eating plan that has been shown to reduce high blood pressure (hypertension). It may also reduce your risk for type 2 diabetes, heart disease, and stroke.  With the DASH eating plan, you should limit salt (sodium) intake to 2,300 mg a day. If you have hypertension, you may need to reduce your sodium intake to 1,500 mg a day.  When on the DASH eating plan, aim to eat more fresh fruits and vegetables, whole grains, lean proteins, low-fat dairy, and heart-healthy fats.  Work with your health care  provider or diet and nutrition specialist (dietitian) to adjust your eating plan to your individual calorie needs. This information is not intended to replace advice given to you by your health care provider. Make sure you discuss any questions you have with your health care provider. Document Released: 11/18/2011 Document Revised: 11/11/2017 Document Reviewed: 11/22/2016 Elsevier Patient Education  [place]. We will check the labs and urine  today.   Watch the salt in the diet.   DASH Eating Plan DASH stands for "Dietary Approaches to Stop Hypertension." The DASH eating plan is a healthy eating plan that has been shown to reduce high blood pressure (hypertension). It may also reduce your risk for type 2 diabetes, heart disease, and stroke. The DASH eating plan may also help with weight loss. What are tips for following this plan?  General guidelines  Avoid eating more than 2,300 mg (milligrams) of salt (sodium) a day. If you have hypertension, you may need to reduce your sodium intake to 1,500 mg a day.  Limit alcohol intake to no more than 1 drink a day for nonpregnant women and 2 drinks a day for men. One drink equals 12 oz of beer, 5 oz of wine, or 1 oz of hard liquor.  Work with your health care provider to maintain a healthy body weight or to lose weight. Ask what an ideal weight is for you.  Get at least 30 minutes of exercise that causes your heart to beat faster (aerobic exercise) most days of the week. Activities may include walking, swimming, or biking.  Work with your health care provider or diet and nutrition specialist (dietitian) to adjust your eating plan to your individual calorie needs. Reading food labels   Check food labels for the amount of sodium per serving. Choose foods with less than 5 percent of the Daily Value of sodium. Generally, foods with less than 300 mg of sodium per serving fit into this eating plan.  To find whole grains, look for the word "whole" as the first word in the ingredient list. Shopping  Buy products labeled as "low-sodium" or "no salt added."  Buy fresh foods. Avoid canned foods and premade or frozen meals. Cooking  Avoid adding salt when cooking. Use salt-free seasonings or herbs instead of table salt or sea salt. Check with your health care provider or pharmacist before using salt substitutes.  Do not fry foods. Cook foods using healthy methods such as baking, boiling,  grilling, and broiling instead.  Cook with heart-healthy oils, such as olive, canola, soybean, or sunflower oil. Meal planning  Eat a balanced diet that includes: ? 5 or more servings of fruits and vegetables each day. At each meal, try to fill half of your plate with fruits and vegetables. ? Up to 6-8 servings of whole grains each day. ? Less than 6 oz of lean meat, poultry, or fish each day. A 3-oz serving of meat is about the same size as a deck of cards. One egg equals 1 oz. ? 2 servings of low-fat dairy each day. ? A serving of nuts, seeds, or beans 5 times each week. ? Heart-healthy fats. Healthy fats called Omega-3 fatty acids are found in foods such as flaxseeds and coldwater fish, like sardines, salmon, and mackerel.  Limit how much you eat of the following: ? Canned or prepackaged foods. ? Food that is high in trans fat, such as fried foods. ? Food that is high in saturated fat, such as fatty meat. ? Sweets,  desserts, sugary drinks, and other foods with added sugar. ? Full-fat dairy products.  Do not salt foods before eating.  Try to eat at least 2 vegetarian meals each week.  Eat more home-cooked food and less restaurant, buffet, and fast food.  When eating at a restaurant, ask that your food be prepared with less salt or no salt, if possible. What foods are recommended? The items listed may not be a complete list. Talk with your dietitian about what dietary choices are best for you. Grains Whole-grain or whole-wheat bread. Whole-grain or whole-wheat pasta. Brown rice. Orpah Cobb. Bulgur. Whole-grain and low-sodium cereals. Pita bread. Low-fat, low-sodium crackers. Whole-wheat flour tortillas. Vegetables Fresh or frozen vegetables (raw, steamed, roasted, or grilled). Low-sodium or reduced-sodium tomato and vegetable juice. Low-sodium or reduced-sodium tomato sauce and tomato paste. Low-sodium or reduced-sodium canned vegetables. Fruits All fresh, dried, or frozen  fruit. Canned fruit in natural juice (without added sugar). Meat and other protein foods Skinless chicken or [place]. Ground chicken or [place]. Pork with fat trimmed off. Fish and seafood. Egg whites. Dried beans, peas, or lentils. Unsalted nuts, nut butters, and seeds. Unsalted canned beans. Lean cuts of beef with fat trimmed off. Low-sodium, lean deli meat. Dairy Low-fat (1%) or fat-free (skim) milk. Fat-free, low-fat, or reduced-fat cheeses. Nonfat, low-sodium ricotta or cottage cheese. Low-fat or nonfat yogurt. Low-fat, low-sodium cheese. Fats and oils Soft margarine without trans fats. Vegetable oil. Low-fat, reduced-fat, or light mayonnaise and salad dressings (reduced-sodium). Canola, safflower, olive, soybean, and sunflower oils. Avocado. Seasoning and other foods Herbs. Spices. Seasoning mixes without salt. Unsalted popcorn and pretzels. Fat-free sweets. What foods are not recommended? The items listed may not be a complete list. Talk with your dietitian about what dietary choices are best for you. Grains Baked goods made with fat, such as croissants, muffins, or some breads. Dry pasta or rice meal packs. Vegetables Creamed or fried vegetables. Vegetables in a cheese sauce. Regular canned vegetables (not low-sodium or reduced-sodium). Regular canned tomato sauce and paste (not low-sodium or reduced-sodium). Regular tomato and vegetable juice (not low-sodium or reduced-sodium). Rosita Fire. Olives. Fruits Canned fruit in a light or heavy syrup. Fried fruit. Fruit in cream or butter sauce. Meat and other protein foods Fatty cuts of meat. Ribs. Fried meat. Tomasa Blase. Sausage. Bologna and other processed lunch meats. Salami. Fatback. Hotdogs. Bratwurst. Salted nuts and seeds. Canned beans with added salt. Canned or smoked fish. Whole eggs or egg yolks. Chicken or [place] with skin. Dairy Whole or 2% milk, cream, and half-and-half. Whole or full-fat cream cheese. Whole-fat or sweetened yogurt. Full-fat  cheese. Nondairy creamers. Whipped toppings. Processed cheese and cheese spreads. Fats and oils Butter. Stick margarine. Lard. Shortening. Ghee. Bacon fat. Tropical oils, such as coconut, palm kernel, or palm oil. Seasoning and other foods Salted popcorn and pretzels. Onion salt, garlic salt, seasoned salt, table salt, and sea salt. Worcestershire sauce. Tartar sauce. Barbecue sauce. Teriyaki sauce. Soy sauce, including reduced-sodium. Steak sauce. Canned and packaged gravies. Fish sauce. Oyster sauce. Cocktail sauce. Horseradish that you find on the shelf. Ketchup. Mustard. Meat flavorings and tenderizers. Bouillon cubes. Hot sauce and Tabasco sauce. Premade or packaged marinades. Premade or packaged taco seasonings. Relishes. Regular salad dressings. Where to find more information:  National Heart, Lung, and Blood Institute: PopSteam.is  American Heart Association: www.heart.org Summary  The DASH eating plan is a healthy eating plan that has been shown to reduce high blood pressure (hypertension). It may also reduce your risk for type 2 diabetes, heart  disease, and stroke.  With the DASH eating plan, you should limit salt (sodium) intake to 2,300 mg a day. If you have hypertension, you may need to reduce your sodium intake to 1,500 mg a day.  When on the DASH eating plan, aim to eat more fresh fruits and vegetables, whole grains, lean proteins, low-fat dairy, and heart-healthy fats.  Work with your health care provider or diet and nutrition specialist (dietitian) to adjust your eating plan to your individual calorie needs. This information is not intended to replace advice given to you by your health care provider. Make sure you discuss any questions you have with your health care provider. Document Released: 11/18/2011 Document Revised: 11/11/2017 Document Reviewed: 11/22/2016 Elsevier Patient Education  2020 [employer].

## 2019-12-10 NOTE — Progress Notes (Signed)
   Subjective:   Patient ID: Cheryl Jones, female    DOB: January 21, 1961, 58 y.o.   MRN: [id number]  HPI The patient is a 58 YO female coming in for her blood pressure. Running high last week at the eye doctor. She felt like she had a uti and took some ciprofloxacin that her mother had. Took 2 pills one day, then 1 pill the next, then 1 pill. She feels that is gone now. She feels that this helped her BP go down. She called on call doctor for the high BP and they refilled her prn clonidine. She took that a couple of times. She doubled her hctz dosing to 50 mg daily. Still taking bystolic 10 mg daily. Denies chest pains or headaches. Did have a headache when her BP was high at the eye doctor's office. Denies headaches over the last month. Did have some changes to diet over the holidays including chex mix.   PMH, [facility], social history reviewed and updated  Review of Systems  Constitutional: Negative.   HENT: Negative.   Eyes: Negative.   Respiratory: Negative for cough, chest tightness and shortness of breath.   Cardiovascular: Negative for chest pain, palpitations and leg swelling.  Gastrointestinal: Negative for abdominal distention, abdominal pain, constipation, diarrhea, nausea and vomiting.  Musculoskeletal: Negative.   Skin: Negative.   Neurological: Negative.   Psychiatric/Behavioral: Negative.     Objective:  Physical Exam Constitutional:      Appearance: She is well-developed.  HENT:     Head: Normocephalic and atraumatic.  Cardiovascular:     Rate and Rhythm: Normal rate and regular rhythm.  Pulmonary:     Effort: Pulmonary effort is normal. No respiratory distress.     Breath sounds: Normal breath sounds. No wheezing or rales.  Abdominal:     General: Bowel sounds are normal. There is no distension.     Palpations: Abdomen is soft.     Tenderness: There is no abdominal tenderness. There is no rebound.  Musculoskeletal:     Cervical back: Normal range of motion.  Skin:  General: Skin is warm and dry.  Neurological:     Mental Status: She is alert and oriented to person, place, and time.     Coordination: Coordination normal.     Vitals:   12/10/19 1026 12/10/19 1107  BP: (!) 146/94 (!) 144/92  Pulse: 73   Temp: 98.1 F (36.7 C)   TempSrc: Oral   SpO2: 99%   Weight: 155 lb (70.3 kg)   Height: 5' (1.524 m)    This visit occurred during the SARS-CoV-2 public health emergency.  Safety protocols were in place, including screening questions prior to the visit, additional usage of staff PPE, and extensive cleaning of exam room while observing appropriate contact time as indicated for disinfecting solutions.   Assessment & Plan:

## 2019-12-10 NOTE — Assessment & Plan Note (Addendum)
Due to past kidney stone and needs CMP today. Needs good BP control to prevent progression. Checking u/a for infection and advised that it is not a good idea and not safe to take medication from other people without medical advice.

## 2019-12-10 NOTE — Assessment & Plan Note (Signed)
Checking HgA1c as getting labs and none in 1 year.

## 2019-12-10 NOTE — Assessment & Plan Note (Signed)
Taking hctz and advised her to not increase past 25 mg daily as BP lowering effects are modest and low potassium risk increases. Checking CMP today. Continue bystolic. Reduce sodium and if BP does not normalize needs to add back medication. Likely valsartan as she tolerated this well in the past.

## 2020-01-04 DIAGNOSIS — N1832 Chronic kidney disease, stage 3b: Secondary | ICD-10-CM | POA: Diagnosis not present

## 2020-01-04 DIAGNOSIS — I129 Hypertensive chronic kidney disease with stage 1 through stage 4 chronic kidney disease, or unspecified chronic kidney disease: Secondary | ICD-10-CM | POA: Diagnosis not present

## 2020-01-29 DIAGNOSIS — N1832 Chronic kidney disease, stage 3b: Secondary | ICD-10-CM | POA: Diagnosis not present

## 2020-01-29 DIAGNOSIS — Z0000 Encounter for general adult medical examination without abnormal findings: Secondary | ICD-10-CM | POA: Diagnosis not present

## 2020-02-01 DIAGNOSIS — Z0000 Encounter for general adult medical examination without abnormal findings: Secondary | ICD-10-CM | POA: Diagnosis not present

## 2020-02-14 DIAGNOSIS — L723 Sebaceous cyst: Secondary | ICD-10-CM | POA: Diagnosis not present

## 2020-03-19 DIAGNOSIS — R7989 Other specified abnormal findings of blood chemistry: Secondary | ICD-10-CM | POA: Diagnosis not present

## 2020-04-04 DIAGNOSIS — K219 Gastro-esophageal reflux disease without esophagitis: Secondary | ICD-10-CM | POA: Diagnosis not present

## 2020-04-04 DIAGNOSIS — N1831 Chronic kidney disease, stage 3a: Secondary | ICD-10-CM | POA: Diagnosis not present

## 2020-04-04 DIAGNOSIS — E663 Overweight: Secondary | ICD-10-CM | POA: Diagnosis not present

## 2020-04-04 DIAGNOSIS — E785 Hyperlipidemia, unspecified: Secondary | ICD-10-CM | POA: Diagnosis not present

## 2020-04-04 DIAGNOSIS — N200 Calculus of kidney: Secondary | ICD-10-CM | POA: Diagnosis not present

## 2020-04-04 DIAGNOSIS — Z6828 Body mass index (BMI) 28.0-28.9, adult: Secondary | ICD-10-CM | POA: Diagnosis not present

## 2020-04-04 DIAGNOSIS — I129 Hypertensive chronic kidney disease with stage 1 through stage 4 chronic kidney disease, or unspecified chronic kidney disease: Secondary | ICD-10-CM | POA: Diagnosis not present

## 2020-04-04 DIAGNOSIS — N261 Atrophy of kidney (terminal): Secondary | ICD-10-CM | POA: Diagnosis not present

## 2020-04-04 DIAGNOSIS — Z79899 Other long term (current) drug therapy: Secondary | ICD-10-CM | POA: Diagnosis not present

## 2020-04-14 DIAGNOSIS — I129 Hypertensive chronic kidney disease with stage 1 through stage 4 chronic kidney disease, or unspecified chronic kidney disease: Secondary | ICD-10-CM | POA: Diagnosis not present

## 2020-04-14 DIAGNOSIS — N261 Atrophy of kidney (terminal): Secondary | ICD-10-CM | POA: Diagnosis not present

## 2020-04-14 DIAGNOSIS — N1831 Chronic kidney disease, stage 3a: Secondary | ICD-10-CM | POA: Diagnosis not present

## 2020-04-15 DIAGNOSIS — R7989 Other specified abnormal findings of blood chemistry: Secondary | ICD-10-CM | POA: Diagnosis not present

## 2020-04-15 DIAGNOSIS — E041 Nontoxic single thyroid nodule: Secondary | ICD-10-CM | POA: Diagnosis not present

## 2020-04-28 DIAGNOSIS — M545 Low back pain: Secondary | ICD-10-CM | POA: Diagnosis not present

## 2020-04-28 DIAGNOSIS — M546 Pain in thoracic spine: Secondary | ICD-10-CM | POA: Diagnosis not present

## 2020-04-28 DIAGNOSIS — M62830 Muscle spasm of back: Secondary | ICD-10-CM | POA: Diagnosis not present

## 2020-04-28 DIAGNOSIS — M542 Cervicalgia: Secondary | ICD-10-CM | POA: Diagnosis not present

## 2020-04-30 DIAGNOSIS — M545 Low back pain: Secondary | ICD-10-CM | POA: Diagnosis not present

## 2020-05-01 ENCOUNTER — Other Ambulatory Visit: Payer: Self-pay | Admitting: Internal Medicine

## 2020-05-01 DIAGNOSIS — M542 Cervicalgia: Secondary | ICD-10-CM | POA: Diagnosis not present

## 2020-05-01 DIAGNOSIS — M62830 Muscle spasm of back: Secondary | ICD-10-CM | POA: Diagnosis not present

## 2020-05-01 DIAGNOSIS — M546 Pain in thoracic spine: Secondary | ICD-10-CM | POA: Diagnosis not present

## 2020-05-01 DIAGNOSIS — M545 Low back pain: Secondary | ICD-10-CM | POA: Diagnosis not present

## 2020-05-05 DIAGNOSIS — M62830 Muscle spasm of back: Secondary | ICD-10-CM | POA: Diagnosis not present

## 2020-05-05 DIAGNOSIS — M545 Low back pain: Secondary | ICD-10-CM | POA: Diagnosis not present

## 2020-05-05 DIAGNOSIS — M546 Pain in thoracic spine: Secondary | ICD-10-CM | POA: Diagnosis not present

## 2020-05-05 DIAGNOSIS — M542 Cervicalgia: Secondary | ICD-10-CM | POA: Diagnosis not present

## 2020-05-13 DIAGNOSIS — M545 Low back pain: Secondary | ICD-10-CM | POA: Diagnosis not present

## 2020-05-13 DIAGNOSIS — M542 Cervicalgia: Secondary | ICD-10-CM | POA: Diagnosis not present

## 2020-05-13 DIAGNOSIS — M62830 Muscle spasm of back: Secondary | ICD-10-CM | POA: Diagnosis not present

## 2020-05-13 DIAGNOSIS — M546 Pain in thoracic spine: Secondary | ICD-10-CM | POA: Diagnosis not present

## 2020-05-14 DIAGNOSIS — M545 Low back pain: Secondary | ICD-10-CM | POA: Diagnosis not present

## 2020-05-14 DIAGNOSIS — M546 Pain in thoracic spine: Secondary | ICD-10-CM | POA: Diagnosis not present

## 2020-05-14 DIAGNOSIS — M25531 Pain in right wrist: Secondary | ICD-10-CM | POA: Diagnosis not present

## 2020-05-14 DIAGNOSIS — M542 Cervicalgia: Secondary | ICD-10-CM | POA: Diagnosis not present

## 2020-05-15 DIAGNOSIS — M546 Pain in thoracic spine: Secondary | ICD-10-CM | POA: Diagnosis not present

## 2020-05-15 DIAGNOSIS — M545 Low back pain: Secondary | ICD-10-CM | POA: Diagnosis not present

## 2020-05-15 DIAGNOSIS — M542 Cervicalgia: Secondary | ICD-10-CM | POA: Diagnosis not present

## 2020-05-15 DIAGNOSIS — M25531 Pain in right wrist: Secondary | ICD-10-CM | POA: Diagnosis not present

## 2020-05-20 DIAGNOSIS — M545 Low back pain: Secondary | ICD-10-CM | POA: Diagnosis not present

## 2020-05-20 DIAGNOSIS — M546 Pain in thoracic spine: Secondary | ICD-10-CM | POA: Diagnosis not present

## 2020-05-20 DIAGNOSIS — M25531 Pain in right wrist: Secondary | ICD-10-CM | POA: Diagnosis not present

## 2020-05-20 DIAGNOSIS — M542 Cervicalgia: Secondary | ICD-10-CM | POA: Diagnosis not present

## 2020-05-22 DIAGNOSIS — M545 Low back pain: Secondary | ICD-10-CM | POA: Diagnosis not present

## 2020-05-22 DIAGNOSIS — M542 Cervicalgia: Secondary | ICD-10-CM | POA: Diagnosis not present

## 2020-05-22 DIAGNOSIS — M25531 Pain in right wrist: Secondary | ICD-10-CM | POA: Diagnosis not present

## 2020-05-22 DIAGNOSIS — M546 Pain in thoracic spine: Secondary | ICD-10-CM | POA: Diagnosis not present

## 2020-05-23 DIAGNOSIS — M546 Pain in thoracic spine: Secondary | ICD-10-CM | POA: Diagnosis not present

## 2020-05-23 DIAGNOSIS — M545 Low back pain: Secondary | ICD-10-CM | POA: Diagnosis not present

## 2020-05-23 DIAGNOSIS — M25531 Pain in right wrist: Secondary | ICD-10-CM | POA: Diagnosis not present

## 2020-05-23 DIAGNOSIS — M542 Cervicalgia: Secondary | ICD-10-CM | POA: Diagnosis not present

## 2020-05-27 DIAGNOSIS — M542 Cervicalgia: Secondary | ICD-10-CM | POA: Diagnosis not present

## 2020-05-27 DIAGNOSIS — M546 Pain in thoracic spine: Secondary | ICD-10-CM | POA: Diagnosis not present

## 2020-05-27 DIAGNOSIS — M545 Low back pain: Secondary | ICD-10-CM | POA: Diagnosis not present

## 2020-05-27 DIAGNOSIS — M25531 Pain in right wrist: Secondary | ICD-10-CM | POA: Diagnosis not present

## 2020-05-29 DIAGNOSIS — M545 Low back pain: Secondary | ICD-10-CM | POA: Diagnosis not present

## 2020-05-29 DIAGNOSIS — M546 Pain in thoracic spine: Secondary | ICD-10-CM | POA: Diagnosis not present

## 2020-05-29 DIAGNOSIS — M25531 Pain in right wrist: Secondary | ICD-10-CM | POA: Diagnosis not present

## 2020-05-29 DIAGNOSIS — M542 Cervicalgia: Secondary | ICD-10-CM | POA: Diagnosis not present

## 2020-05-30 DIAGNOSIS — M545 Low back pain: Secondary | ICD-10-CM | POA: Diagnosis not present

## 2020-05-30 DIAGNOSIS — M542 Cervicalgia: Secondary | ICD-10-CM | POA: Diagnosis not present

## 2020-05-30 DIAGNOSIS — M546 Pain in thoracic spine: Secondary | ICD-10-CM | POA: Diagnosis not present

## 2020-05-30 DIAGNOSIS — M25531 Pain in right wrist: Secondary | ICD-10-CM | POA: Diagnosis not present

## 2020-06-05 DIAGNOSIS — M546 Pain in thoracic spine: Secondary | ICD-10-CM | POA: Diagnosis not present

## 2020-06-05 DIAGNOSIS — M545 Low back pain: Secondary | ICD-10-CM | POA: Diagnosis not present

## 2020-06-05 DIAGNOSIS — M542 Cervicalgia: Secondary | ICD-10-CM | POA: Diagnosis not present

## 2020-06-05 DIAGNOSIS — M25531 Pain in right wrist: Secondary | ICD-10-CM | POA: Diagnosis not present

## 2020-07-21 ENCOUNTER — Other Ambulatory Visit: Payer: Self-pay | Admitting: Internal Medicine

## 2020-09-25 DIAGNOSIS — Z0000 Encounter for general adult medical examination without abnormal findings: Secondary | ICD-10-CM | POA: Diagnosis not present

## 2020-09-25 DIAGNOSIS — Z6828 Body mass index (BMI) 28.0-28.9, adult: Secondary | ICD-10-CM | POA: Diagnosis not present

## 2020-09-25 DIAGNOSIS — E785 Hyperlipidemia, unspecified: Secondary | ICD-10-CM | POA: Diagnosis not present

## 2020-09-25 DIAGNOSIS — Z1231 Encounter for screening mammogram for malignant neoplasm of breast: Secondary | ICD-10-CM | POA: Diagnosis not present

## 2020-09-25 DIAGNOSIS — R7989 Other specified abnormal findings of blood chemistry: Secondary | ICD-10-CM | POA: Diagnosis not present

## 2020-09-25 DIAGNOSIS — Z01419 Encounter for gynecological examination (general) (routine) without abnormal findings: Secondary | ICD-10-CM | POA: Diagnosis not present

## 2020-09-25 DIAGNOSIS — N1832 Chronic kidney disease, stage 3b: Secondary | ICD-10-CM | POA: Diagnosis not present

## 2020-10-08 ENCOUNTER — Other Ambulatory Visit: Payer: Self-pay | Admitting: Obstetrics and Gynecology

## 2020-10-08 DIAGNOSIS — N951 Menopausal and female climacteric states: Secondary | ICD-10-CM

## 2020-10-13 DIAGNOSIS — R7989 Other specified abnormal findings of blood chemistry: Secondary | ICD-10-CM | POA: Diagnosis not present

## 2020-10-13 DIAGNOSIS — E7800 Pure hypercholesterolemia, unspecified: Secondary | ICD-10-CM | POA: Diagnosis not present

## 2020-10-13 DIAGNOSIS — E7801 Familial hypercholesterolemia: Secondary | ICD-10-CM | POA: Diagnosis not present

## 2020-10-14 ENCOUNTER — Ambulatory Visit
Admission: RE | Admit: 2020-10-14 | Discharge: 2020-10-14 | Disposition: A | Discharge status: Home / Self Care | Payer: BC Managed Care – PPO | Source: Ambulatory Visit | Attending: Obstetrics and Gynecology | Admitting: Obstetrics and Gynecology

## 2020-10-14 ENCOUNTER — Other Ambulatory Visit: Payer: Self-pay

## 2020-10-14 DIAGNOSIS — N951 Menopausal and female climacteric states: Secondary | ICD-10-CM

## 2020-10-14 DIAGNOSIS — Z780 Asymptomatic menopausal state: Secondary | ICD-10-CM | POA: Diagnosis not present

## 2021-10-21 ENCOUNTER — Telehealth: Payer: Self-pay | Admitting: Internal Medicine

## 2021-10-21 NOTE — Telephone Encounter (Signed)
Patient called wanting to know when she started seeing Dr. Posey Rea when did he diagnose her with carpal tunnel. Patient would like to be called back at [phone]

## 2021-10-21 NOTE — Telephone Encounter (Signed)
Called pt there was no answer [facility] stating per chart documentation of carpal tunnel was back in 2008, 09/11/2007.Marland KitchenRaechel Chute

## 2023-10-27 ENCOUNTER — Encounter: Payer: Self-pay | Admitting: Internal Medicine
